# Patient Record
Sex: Female | Born: 1960
Health system: Southern US, Community
[De-identification: ages and names within clinical notes are randomized; demographics above are authoritative.]

## PROBLEM LIST (undated history)

## (undated) DIAGNOSIS — K635 Polyp of colon: Secondary | ICD-10-CM

## (undated) DIAGNOSIS — R636 Underweight: Secondary | ICD-10-CM

## (undated) DIAGNOSIS — C50919 Malignant neoplasm of unspecified site of unspecified female breast: Secondary | ICD-10-CM

## (undated) DIAGNOSIS — Z923 Personal history of irradiation: Secondary | ICD-10-CM

## (undated) DIAGNOSIS — K649 Unspecified hemorrhoids: Secondary | ICD-10-CM

## (undated) DIAGNOSIS — K589 Irritable bowel syndrome without diarrhea: Secondary | ICD-10-CM

## (undated) DIAGNOSIS — M84376A Stress fracture, unspecified foot, initial encounter for fracture: Secondary | ICD-10-CM

## (undated) HISTORY — DX: Polyp of colon: K63.5

## (undated) HISTORY — DX: Malignant neoplasm of unspecified site of unspecified female breast: C50.919

## (undated) HISTORY — DX: Underweight: R63.6

## (undated) HISTORY — DX: Irritable bowel syndrome without diarrhea: K58.9

## (undated) HISTORY — DX: Stress fracture, unspecified foot, initial encounter for fracture: M84.376A

## (undated) HISTORY — DX: Personal history of irradiation: Z92.3

---

## 2002-04-26 HISTORY — PX: BREAST SURGERY: SHX581

## 2004-04-26 HISTORY — PX: BREAST BIOPSY: SHX20

## 2004-04-26 HISTORY — PX: COLONOSCOPY: SHX174

## 2004-07-31 ENCOUNTER — Ambulatory Visit: Payer: Self-pay | Admitting: Unknown Physician Specialty

## 2004-08-25 ENCOUNTER — Ambulatory Visit: Payer: Self-pay | Admitting: Gastroenterology

## 2004-09-24 ENCOUNTER — Ambulatory Visit: Payer: Self-pay | Admitting: Unknown Physician Specialty

## 2005-04-02 ENCOUNTER — Ambulatory Visit: Payer: Self-pay | Admitting: Unknown Physician Specialty

## 2005-04-07 ENCOUNTER — Ambulatory Visit: Payer: Self-pay | Admitting: Unknown Physician Specialty

## 2005-04-26 DIAGNOSIS — K589 Irritable bowel syndrome without diarrhea: Secondary | ICD-10-CM

## 2005-04-26 HISTORY — DX: Irritable bowel syndrome, unspecified: K58.9

## 2005-08-27 ENCOUNTER — Ambulatory Visit: Payer: Self-pay | Admitting: Unknown Physician Specialty

## 2007-08-24 ENCOUNTER — Emergency Department: Payer: Self-pay | Admitting: Internal Medicine

## 2009-06-13 ENCOUNTER — Ambulatory Visit: Payer: Self-pay | Admitting: Unknown Physician Specialty

## 2009-12-25 HISTORY — PX: EXPLORATORY LAPAROTOMY: SUR591

## 2009-12-25 HISTORY — PX: ENDOMETRIAL ABLATION: SHX621

## 2010-01-05 ENCOUNTER — Ambulatory Visit: Payer: Self-pay | Admitting: Unknown Physician Specialty

## 2010-01-08 ENCOUNTER — Ambulatory Visit: Payer: Self-pay | Admitting: Unknown Physician Specialty

## 2010-01-12 LAB — PATHOLOGY REPORT

## 2010-06-18 ENCOUNTER — Ambulatory Visit: Payer: Self-pay | Admitting: Unknown Physician Specialty

## 2010-11-12 ENCOUNTER — Ambulatory Visit: Payer: Self-pay | Admitting: General Practice

## 2010-12-10 ENCOUNTER — Encounter: Payer: Self-pay | Admitting: Urology

## 2010-12-26 ENCOUNTER — Encounter: Payer: Self-pay | Admitting: Urology

## 2011-01-25 ENCOUNTER — Encounter: Payer: Self-pay | Admitting: Urology

## 2011-02-25 ENCOUNTER — Encounter: Payer: Self-pay | Admitting: Urology

## 2011-03-27 ENCOUNTER — Encounter: Payer: Self-pay | Admitting: Urology

## 2011-05-16 ENCOUNTER — Ambulatory Visit: Payer: Self-pay | Admitting: Orthopedic Surgery

## 2011-06-22 ENCOUNTER — Encounter: Payer: Self-pay | Admitting: Internal Medicine

## 2011-06-22 ENCOUNTER — Ambulatory Visit (INDEPENDENT_AMBULATORY_CARE_PROVIDER_SITE_OTHER): Payer: BC Managed Care – PPO | Admitting: Internal Medicine

## 2011-06-22 VITALS — BP 92/50 | HR 88 | Temp 97.8°F | Resp 14 | Ht 67.5 in | Wt 119.5 lb

## 2011-06-22 DIAGNOSIS — R232 Flushing: Secondary | ICD-10-CM

## 2011-06-22 DIAGNOSIS — K589 Irritable bowel syndrome without diarrhea: Secondary | ICD-10-CM

## 2011-06-22 DIAGNOSIS — Z13220 Encounter for screening for lipoid disorders: Secondary | ICD-10-CM

## 2011-06-22 DIAGNOSIS — Z1239 Encounter for other screening for malignant neoplasm of breast: Secondary | ICD-10-CM

## 2011-06-22 DIAGNOSIS — M84376A Stress fracture, unspecified foot, initial encounter for fracture: Secondary | ICD-10-CM | POA: Insufficient documentation

## 2011-06-22 DIAGNOSIS — J45909 Unspecified asthma, uncomplicated: Secondary | ICD-10-CM

## 2011-06-22 DIAGNOSIS — R636 Underweight: Secondary | ICD-10-CM

## 2011-06-22 DIAGNOSIS — N951 Menopausal and female climacteric states: Secondary | ICD-10-CM

## 2011-06-22 NOTE — Patient Instructions (Signed)
I will send you your labs after I review them if you sign up before they are resulted

## 2011-06-22 NOTE — Assessment & Plan Note (Addendum)
Diagnoses made by Dr. Mechele Collin after evaluation with normal colonoscopy 2007,  Now managed with dietary avoidance of caffeine and dairy

## 2011-06-22 NOTE — Progress Notes (Signed)
Subjective:    Patient ID: Kim Woods, female    DOB: 13-Mar-1961, 51 y.o.   MRN: [id number]  HPI  Kim Woods is a 51 yr old white female with no significant past medical history who is here to establish primary care. She was recently treated for presumed candidal vaginitis by Kim Woods  initially with antifungal ointment  followed by a  one time dose of fluconazole.   Her vaginalDischarge has cleared up but she still feels "raw" down there. she does not douche and has not changed soaps. She may be perimenopausal as she has been recently experiencing hot flashes she has a History of endometrial ablation Sept 2011 for heavy periods,   which caused her to spot for 6 months ,  followed by amenorrhea . Had a recent cortisone shot in right foot one month prior to onset of hot flashes and  vaginal symptoms .  She  is a history of adverse reactions involving numbness and tingling of the hands and feet to several unrelated antibiotics including ciprofloxacin and azithromycin as well as Macrobid.  She underwent a prior neurological workup for multiple sclerosis after the first incident of numbness and tingling.  She also has a history of a sesamoid bone fracture of the right foot which did not respond to orthopedic bleeding but only to the steroid injection that she received   Past Medical History  Diagnosis Date  . Stress fracture of foot   . Patient underweight   . Asthma     precipitated by seasonal allergies    No current outpatient prescriptions on file prior to visit.    Review of Systems  Constitutional: Negative for fever, chills and unexpected weight change.  HENT: Negative for hearing loss, ear pain, nosebleeds, congestion, sore throat, facial swelling, rhinorrhea, sneezing, mouth sores, trouble swallowing, neck pain, neck stiffness, voice change, postnasal drip, sinus pressure, tinnitus and ear discharge.   Eyes: Negative for pain, discharge, redness and visual disturbance.  Respiratory:  Negative for cough, chest tightness, shortness of breath, wheezing and stridor.   Cardiovascular: Negative for chest pain, palpitations and leg swelling.  Musculoskeletal: Negative for myalgias and arthralgias.  Skin: Negative for color change and rash.  Neurological: Negative for dizziness, weakness, light-headedness and headaches.  Hematological: Negative for adenopathy.       Objective:   Physical Exam  Constitutional: She is oriented to person, place, and time. She appears well-developed and well-nourished.       Underweight   HENT:  Mouth/Throat: Oropharynx is clear and moist.  Eyes: EOM are normal. Pupils are equal, round, and reactive to light. No scleral icterus.  Neck: Normal range of motion. Neck supple. No JVD present. No thyromegaly present.  Cardiovascular: Normal rate, regular rhythm, normal heart sounds and intact distal pulses.   Pulmonary/Chest: Effort normal and breath sounds normal.  Abdominal: Soft. Bowel sounds are normal. She exhibits no mass. There is no tenderness.  Musculoskeletal: Normal range of motion. She exhibits no edema.  Lymphadenopathy:    She has no cervical adenopathy.  Neurological: She is alert and oriented to person, place, and time.  Skin: Skin is warm and dry.  Psychiatric: She has a normal mood and affect.       Assessment & Plan:   Irritable bowel syndrome Diagnoses made by Dr. Mechele Collin after evaluation with normal colonoscopy 2007,  Now managed with dietary avoidance of caffeine and dairy  Asthma with allergic rhinitis She has been prescribed Flovent in the past for  episodes of asthma but appear to be precipitated by seasonal allergies. She has reduced her dose to 50 mcg and would like to discontinue it completely. I recommended that she begin Zyrtec daily starting 1-2 weeks prior to this season which typically give her trouble mainly spring and fall he has not had formal allergy testing. She's also not had formal pulmonary function  testing. We'll defer this testing for now and  Patient underweight Patient's weight has been stable for several years she has been under weight her entire adult life. She has no marfanoid features. There is no family history of Marfan is a poor or signs or symptoms of Marfanism.  She has had her thyroid checked repeatedly as mental function is normal. She has no history of eating disorders and recognizes that she is underweight. I recommended that she start a weightlifting program to build especially given her her at increased risk for osteoporosis and, and consider any more protein to her .diet    Updated Medication List Outpatient Encounter Prescriptions as of 06/22/2011  Medication Sig Dispense Refill  . calcium citrate-vitamin D 200-200 MG-UNIT TABS Take 2 tablets by mouth daily.      . fluticasone (FLOVENT DISKUS) 50 MCG/BLIST diskus inhaler Inhale 1 puff into the lungs 2 (two) times daily.

## 2011-06-22 NOTE — Assessment & Plan Note (Signed)
Patient's weight has been stable for several years she has been under weight her entire adult life. She has no marfanoid features. There is no family history of Marfan is a poor or signs or symptoms of Marfanism.  She has had her thyroid checked repeatedly as mental function is normal. She has no history of eating disorders and recognizes that she is underweight. I recommended that she start a weightlifting program to build especially given her her at increased risk for osteoporosis and, and consider any more protein to her .diet

## 2011-06-22 NOTE — Assessment & Plan Note (Signed)
She has been prescribed Flovent in the past for episodes of asthma but appear to be precipitated by seasonal allergies. She has reduced her dose to 50 mcg and would like to discontinue it completely. I recommended that she begin Zyrtec daily starting 1-2 weeks prior to this season which typically give her trouble mainly spring and fall he has not had formal allergy testing. She's also not had formal pulmonary function testing. We'll defer this testing for now and

## 2011-06-25 ENCOUNTER — Ambulatory Visit: Payer: BC Managed Care – PPO

## 2011-06-25 ENCOUNTER — Other Ambulatory Visit (INDEPENDENT_AMBULATORY_CARE_PROVIDER_SITE_OTHER): Payer: BC Managed Care – PPO | Admitting: *Deleted

## 2011-06-25 DIAGNOSIS — N951 Menopausal and female climacteric states: Secondary | ICD-10-CM

## 2011-06-25 DIAGNOSIS — K589 Irritable bowel syndrome without diarrhea: Secondary | ICD-10-CM

## 2011-06-25 DIAGNOSIS — Z1239 Encounter for other screening for malignant neoplasm of breast: Secondary | ICD-10-CM

## 2011-06-25 DIAGNOSIS — Z13220 Encounter for screening for lipoid disorders: Secondary | ICD-10-CM

## 2011-06-25 DIAGNOSIS — R232 Flushing: Secondary | ICD-10-CM

## 2011-06-25 LAB — LIPID PANEL
HDL: 68.1 mg/dL (ref >39.00)
LDL Cholesterol: 67 mg/dL (ref 0–99)
Total CHOL/HDL Ratio: 2
Triglycerides: 31 mg/dL (ref 0.0–149.0)

## 2011-06-25 LAB — COMPREHENSIVE METABOLIC PANEL
ALT: 15 U/L (ref 0–35)
AST: 17 U/L (ref 0–37)
CO2: 27 mEq/L (ref 19–32)
Calcium: 9.2 mg/dL (ref 8.4–10.5)
Chloride: 102 mEq/L (ref 96–112)
Creatinine, Ser: 0.9 mg/dL (ref 0.4–1.2)
GFR: 74.52 mL/min (ref >60.00)
Sodium: 136 mEq/L (ref 135–145)
Total Bilirubin: 0.6 mg/dL (ref 0.3–1.2)
Total Protein: 7.3 g/dL (ref 6.0–8.3)

## 2011-06-25 LAB — LUTEINIZING HORMONE: LH: 10.9 m[IU]/mL

## 2011-06-25 LAB — HM PAP SMEAR: HM Pap smear: NORMAL

## 2011-07-01 ENCOUNTER — Telehealth: Payer: Self-pay | Admitting: *Deleted

## 2011-07-01 MED ORDER — FLUCONAZOLE 150 MG PO TABS: 150.0000 mg | ORAL_TABLET | Freq: Once | ORAL | Status: AC

## 2011-07-01 NOTE — Telephone Encounter (Signed)
Triage Record Num: [id number] Operator: Amy Head Patient Name: Kim Woods Call Date & Time: 06/30/2011 5:20:38PM Patient Phone: [phone] PCP: Duncan Dull Patient Gender: Female PCP Fax : [phone] Patient DOB: [birth year] Practice Name: [facility] Reason for Call: Caller: [name]/Patient; PCP: Duncan Dull; CB#: [phone]; ; ; Call regarding Yeast Infection; Spoke with Dr. Darrick Huntsman about this last week when she was in the office. Was told if sxs persisted to try another round of Diflucan. Has taken Diflucan (06/14/11) and Nystatin Ointment externally for 10 days past that. (Both meds were prescribed by OBGYN). Pt placed a call to OBGYN office today and has not heard back. SXS improved somewhat. Still having itching and irritation with a small amount of discharge. Afebrile. All emergent sxs per Vaginal Discharge Or Irritation protocol r/o. Home care advice given. Advised to F/U with PCP or OBGYN within 24 hours per guidelines. Protocol(s) Used: Vaginal Discharge or Irritation Recommended Outcome per Protocol: See Provider within 24 hours Reason for Outcome: Discharge or vaginal symptoms worsening despite 72 hours or more of treatment Care Advice: Call your provider if you develop constant low back pain, constant or worsening lower abdominal pain or tenderness, temperature of 100.5 F (38.1 C), or any temperature elevation if immunocompromised (such as diabetes, HIV/AIDS, renal disease, chemotherapy, organ transplant, or chronic steroid use). ~ Do not have sex with a partner who has a genital lesion (penis, scrotum, vulva, vagina, anus) or a discharge from the vagina or penis, or when you have a lesion or discharge. When resuming sexual relations, it is best to always use a condom to avoid an STD (sexually transmitted disease). Having multiple sexual partner increases your chance of getting an STD. ~ ~ SYMPTOM / CONDITION MANAGEMENT ~ CAUTIONS ~ To help relieve  itching/irritation, try a cool compress to vulva using a washcloth for 10-15 minutes. If pregnancy is known or suspected, get advice from provider before using any nonprescription medication other than acetaminophen ~ Refrain from douching, using scented deodorant tampons, or nonprescription medication until evaluated by provider. Do not use feminine hygiene sprays. Use condoms during sex. ~ 06/30/2011 5:33:07PM Page 1 of 1 [id number]

## 2011-07-01 NOTE — Telephone Encounter (Signed)
Patient notified Rx called in.

## 2011-07-01 NOTE — Telephone Encounter (Signed)
Ok to call in fluconazole 150 mg one tablet daily for 2 days  #2 no refllls.

## 2011-07-12 ENCOUNTER — Telehealth: Payer: Self-pay | Admitting: Internal Medicine

## 2011-07-12 NOTE — Telephone Encounter (Signed)
Patient called requesting her lab results.  It looks like they are in her chart but no comments are made yet.  Please advise.

## 2011-07-12 NOTE — Telephone Encounter (Signed)
she was notified  on march 3 by [facility] that her hormone level were not menopausal, but we didn't tell her that her cholesterol and liver/kidney function were normal. ,

## 2011-07-13 NOTE — Telephone Encounter (Signed)
Left message asking patient to call the office.  

## 2011-07-15 NOTE — Telephone Encounter (Signed)
Patient notified of results.

## 2011-07-20 ENCOUNTER — Ambulatory Visit: Payer: Self-pay | Admitting: Internal Medicine

## 2011-07-28 ENCOUNTER — Encounter: Payer: Self-pay | Admitting: Internal Medicine

## 2011-08-18 ENCOUNTER — Telehealth: Payer: Self-pay | Admitting: Internal Medicine

## 2011-08-18 NOTE — Telephone Encounter (Signed)
[phone] Pt called wanted recommendation  for a gyn she used to go to dr kincus and she is not happy with the care she is getting @ [facility] now Who would you recommend. Does not necessary have to be in [place]

## 2011-08-18 NOTE — Telephone Encounter (Signed)
Dr. Greggory Keen,  [facility]

## 2011-08-19 NOTE — Telephone Encounter (Signed)
Patient notified

## 2011-09-15 ENCOUNTER — Encounter: Payer: Self-pay | Admitting: Obstetrics and Gynecology

## 2011-10-01 ENCOUNTER — Ambulatory Visit (INDEPENDENT_AMBULATORY_CARE_PROVIDER_SITE_OTHER): Payer: BC Managed Care – PPO | Admitting: Internal Medicine

## 2011-10-01 ENCOUNTER — Encounter: Payer: Self-pay | Admitting: Internal Medicine

## 2011-10-01 VITALS — BP 82/52 | HR 74 | Temp 97.7°F | Resp 16 | Wt 111.8 lb

## 2011-10-01 DIAGNOSIS — N760 Acute vaginitis: Secondary | ICD-10-CM

## 2011-10-01 DIAGNOSIS — M8430XA Stress fracture, unspecified site, initial encounter for fracture: Secondary | ICD-10-CM

## 2011-10-01 DIAGNOSIS — L603 Nail dystrophy: Secondary | ICD-10-CM

## 2011-10-01 DIAGNOSIS — L608 Other nail disorders: Secondary | ICD-10-CM

## 2011-10-01 DIAGNOSIS — R636 Underweight: Secondary | ICD-10-CM

## 2011-10-01 DIAGNOSIS — M84376A Stress fracture, unspecified foot, initial encounter for fracture: Secondary | ICD-10-CM

## 2011-10-01 NOTE — Progress Notes (Signed)
Patient ID: Kim Woods, female   DOB: 1961-04-24, 51 y.o.   MRN: [id number] Patient Active Problem List  Diagnoses  . Stress fracture of foot  . Patient underweight  . Irritable bowel syndrome  . Asthma with allergic rhinitis  . Nail dystrophy  . Underweight  . Vaginitis and vulvovaginitis    Subjective:  CC:   Chief Complaint  Patient presents with  . Blood Sugar Problem    HPI:   Kim Woods a 51 y.o. female who presents  Past Medical History  Diagnosis Date  . Stress fracture of foot   . Patient underweight   . Asthma     precipitated by seasonal allergies     Past Surgical History  Procedure Date  . Breast surgery     left, normal age 14  . Endometrial ablation          The following portions of the patient's history were reviewed and updated as appropriate: Allergies, current medications, and problem list.    Review of Systems:   14 Pt  review of systems was negative except those addressed in the HPI,     History   Social History  . Marital Status: Married    Spouse Name: N/A    Number of Children: N/A  . Years of Education: N/A   Occupational History  . Not on file.   Social History Main Topics  . Smoking status: Never Smoker   . Smokeless tobacco: Never Used  . Alcohol Use: Yes  . Drug Use: No  . Sexually Active: Not on file   Other Topics Concern  . Not on file   Social History Narrative  . No narrative on file    Objective:  BP 82/52  Pulse 74  Temp(Src) 97.7 F (36.5 C) (Oral)  Resp 16  Wt 111 lb 12 oz (50.689 kg)  SpO2 94%  General appearance: alert, cooperative and appears stated age Ears: normal TM's and external ear canals both ears Throat: lips, mucosa, and tongue normal; teeth and gums normal Neck: no adenopathy, no carotid bruit, supple, symmetrical, trachea midline and thyroid not enlarged, symmetric, no tenderness/mass/nodules Back: symmetric, no curvature. ROM normal. No CVA tenderness. Lungs: clear to  auscultation bilaterally Heart: regular rate and rhythm, S1, S2 normal, no murmur, click, rub or gallop Abdomen: soft, non-tender; bowel sounds normal; no masses,  no organomegaly Pulses: 2+ and symmetric Skin: Skin color, texture, turgor normal. No rashes or lesions Lymph nodes: Cervical, supraclavicular, and axillary nodes normal.  Assessment and Plan:  Nail dystrophy Unclear what her nail dystrophy is caused by.  Will refer to dermatology  Vaginitis and vulvovaginitis Etiology unclear.  i do not have records so it is unclear what the cultures have grown.  She has no signs of diabetes . Reassurance provided.   Stress fracture of foot Given her very low BMI of 17 and slow to heal fracture,  Will check protein stores, thyroid,  DEXA scan and Vit D level.     Updated Medication List Outpatient Encounter Prescriptions as of 10/01/2011  Medication Sig Dispense Refill  . calcium citrate-vitamin D 200-200 MG-UNIT TABS Take 2 tablets by mouth daily.      . fish oil-omega-3 fatty acids 1000 MG capsule Take 2 g by mouth daily.      . fluticasone (FLOVENT DISKUS) 50 MCG/BLIST diskus inhaler Inhale 1 puff into the lungs 2 (two) times daily.      . vitamin C (ASCORBIC ACID) 500 MG tablet Take  500 mg by mouth daily.         Orders Placed This Encounter  Procedures  . DG Bone Density  . Vitamin D 25 hydroxy  . TSH  . COMPLETE METABOLIC PANEL WITH GFR  . Heavy metals screen, urine    No Follow-up on file.

## 2011-10-03 NOTE — Assessment & Plan Note (Signed)
Etiology unclear.  i do not have records so it is unclear what the cultures have grown.  She has no signs of diabetes . Reassurance provided.

## 2011-10-03 NOTE — Assessment & Plan Note (Signed)
Given her very low BMI of 17 and slow to heal fracture,  Will check protein stores, thyroid,  DEXA scan and Vit D level.

## 2011-10-03 NOTE — Assessment & Plan Note (Signed)
Unclear what her nail dystrophy is caused by.  Will refer to dermatology

## 2011-10-05 NOTE — Progress Notes (Signed)
Addended by: Jobie Quaker on: 10/05/2011 04:44 PM   Modules accepted: Orders

## 2011-10-06 ENCOUNTER — Other Ambulatory Visit (INDEPENDENT_AMBULATORY_CARE_PROVIDER_SITE_OTHER): Payer: BC Managed Care – PPO | Admitting: *Deleted

## 2011-10-06 DIAGNOSIS — R636 Underweight: Secondary | ICD-10-CM

## 2011-10-06 DIAGNOSIS — M8430XA Stress fracture, unspecified site, initial encounter for fracture: Secondary | ICD-10-CM

## 2011-10-06 DIAGNOSIS — M84376A Stress fracture, unspecified foot, initial encounter for fracture: Secondary | ICD-10-CM

## 2011-10-06 LAB — TSH: TSH: 1.26 u[IU]/mL (ref 0.35–5.50)

## 2011-10-07 LAB — COMPLETE METABOLIC PANEL WITH GFR
Albumin: 4.3 g/dL (ref 3.5–5.2)
Alkaline Phosphatase: 41 U/L (ref 39–117)
BUN: 11 mg/dL (ref 6–23)
CO2: 25 mEq/L (ref 19–32)
Calcium: 9.5 mg/dL (ref 8.4–10.5)
Chloride: 102 mEq/L (ref 96–112)
GFR, Est African American: >89 mL/min
GFR, Est Non African American: 80 mL/min
Glucose, Bld: 71 mg/dL (ref 70–99)
Potassium: 4.2 mEq/L (ref 3.5–5.3)
Sodium: 137 mEq/L (ref 135–145)
Total Protein: 7.2 g/dL (ref 6.0–8.3)

## 2011-10-08 ENCOUNTER — Other Ambulatory Visit: Payer: Self-pay | Admitting: Internal Medicine

## 2011-10-08 DIAGNOSIS — N760 Acute vaginitis: Secondary | ICD-10-CM

## 2011-10-09 LAB — HEAVY METALS SCREEN, URINE: Arsenic, 24H Ur: 3 mcg/L (ref <81)

## 2011-10-18 ENCOUNTER — Encounter: Payer: Self-pay | Admitting: Obstetrics and Gynecology

## 2011-10-19 ENCOUNTER — Telehealth: Payer: Self-pay | Admitting: Internal Medicine

## 2011-10-19 ENCOUNTER — Encounter: Payer: Self-pay | Admitting: Internal Medicine

## 2011-10-19 DIAGNOSIS — N761 Subacute and chronic vaginitis: Secondary | ICD-10-CM

## 2011-10-27 ENCOUNTER — Telehealth: Payer: Self-pay | Admitting: Internal Medicine

## 2011-10-27 NOTE — Telephone Encounter (Signed)
Patient wanting labs put in my chart wants them to be in my chart by Monday. If you are unable to do this please call patient to pick up labs.

## 2011-10-29 NOTE — Telephone Encounter (Signed)
Patient is going to pick up lab results on Monday.

## 2011-11-15 NOTE — Telephone Encounter (Signed)
Opened in error

## 2011-11-17 ENCOUNTER — Ambulatory Visit: Payer: Self-pay | Admitting: Internal Medicine

## 2011-11-19 ENCOUNTER — Telehealth: Payer: Self-pay | Admitting: Internal Medicine

## 2011-11-19 NOTE — Telephone Encounter (Signed)
Patient notified of results.

## 2011-11-19 NOTE — Telephone Encounter (Signed)
Left message asking patient to return my call.

## 2011-11-19 NOTE — Telephone Encounter (Signed)
Bone density test was excellent normal bone density.

## 2011-11-30 ENCOUNTER — Encounter: Payer: Self-pay | Admitting: Internal Medicine

## 2012-04-26 DIAGNOSIS — Z923 Personal history of irradiation: Secondary | ICD-10-CM

## 2012-04-26 HISTORY — PX: BREAST LUMPECTOMY: SHX2

## 2012-04-26 HISTORY — DX: Personal history of irradiation: Z92.3

## 2012-04-26 HISTORY — PX: BREAST BIOPSY: SHX20

## 2012-06-10 ENCOUNTER — Other Ambulatory Visit: Payer: Self-pay

## 2012-06-21 ENCOUNTER — Telehealth: Payer: Self-pay | Admitting: Internal Medicine

## 2012-06-21 ENCOUNTER — Encounter: Payer: Self-pay | Admitting: Internal Medicine

## 2012-06-21 ENCOUNTER — Ambulatory Visit (INDEPENDENT_AMBULATORY_CARE_PROVIDER_SITE_OTHER): Payer: BC Managed Care – PPO | Admitting: Internal Medicine

## 2012-06-21 VITALS — BP 98/68 | HR 70 | Temp 97.7°F | Resp 12 | Ht 67.5 in | Wt 117.2 lb

## 2012-06-21 DIAGNOSIS — N760 Acute vaginitis: Secondary | ICD-10-CM

## 2012-06-21 DIAGNOSIS — N63 Unspecified lump in unspecified breast: Secondary | ICD-10-CM

## 2012-06-21 DIAGNOSIS — N6320 Unspecified lump in the left breast, unspecified quadrant: Secondary | ICD-10-CM

## 2012-06-21 DIAGNOSIS — C50919 Malignant neoplasm of unspecified site of unspecified female breast: Secondary | ICD-10-CM | POA: Insufficient documentation

## 2012-06-21 NOTE — Assessment & Plan Note (Addendum)
Found by self exam one week ago. On my exam she has a 0.5 cm solid mobile mass in the left breast at the 1:00 position adjacent to nipple .  Diagnostic mammogram with ultrasound was done at [facility] today and did not show a mass at this site, but targeted ultrasound confirmed the presence of a irregular hypoechoic mass with non-circumcised margins measuring 8 x 6 x 8 mm.    Given patient's recent use of estrogen for several months, increased concern for breast cancer raised. Referral to Dr. Adela Glimpse with appointment on March 11.

## 2012-06-21 NOTE — Progress Notes (Signed)
Patient ID: Kim Woods, female   DOB: 1960-07-18, 52 y.o.   MRN: [id number]     Patient Active Problem List  Diagnosis  . Stress fracture of foot  . Patient underweight  . Irritable bowel syndrome  . Asthma with allergic rhinitis  . Nail dystrophy  . Underweight  . Vaginitis and vulvovaginitis  . Mass of breast, left    Subjective:  CC:   Chief Complaint  Patient presents with  . lump left breast    HPI:   Kim Woods a 52 y.o. female who presents with a left sided breast mass.  Kim Woods has a history of left breast  biopsy in 2004 which was benign, recently treated by OBGYN with vaginal followed by transdermal estrogen from April 2013 to July 2013 for persistent vulvovaginitis,  who reports feeling a new lump in her left breast one week ago .  She is s/p  NOVASURE endometrial ablation Sept 2011 for heavy menses resulting in anemia and has not had a menstrual cycle since then so she is not sure if the change is related to hormonal fluctuation but she reports no breast tenderness and no recent URI symptoms. Her last screening mammogram was  July 20 2011 at [place], with scattered calcifications and dense breasts noted,  BIRADS 2 by report.    Past Medical History  Diagnosis Date  . Stress fracture of foot   . Patient underweight   . Asthma     precipitated by seasonal allergies   . Irritable bowel syndrome 2007    GI eval with colonoscopy [facility]    Past Surgical History  Procedure Laterality Date  . Breast surgery      left, normal age 83  . Endometrial ablation  Sept 2011    Novasure   Family History  Problem Relation Age of Onset  . Cancer Mother     uterine  . Mental illness Father 107    parkinsons disease  . Cancer Paternal Grandmother     ostesarcoma hip,    The following portions of the patient's history were reviewed and updated as appropriate: Allergies, current medications, and problem list.    Review of Systems:  Patient denies headache,  fevers, malaise, unintentional weight loss, skin rash, eye pain, sinus congestion and sinus pain, sore throat, dysphagia,  hemoptysis , cough, dyspnea, wheezing, chest pain, palpitations, orthopnea, edema, abdominal pain, nausea, melena, diarrhea, constipation, flank pain, dysuria, hematuria, urinary  Frequency, nocturia, numbness, tingling, seizures,  Focal weakness, Loss of consciousness,  Tremor, insomnia, depression, anxiety, and suicidal ideation.     History   Social History  . Marital Status: Married    Spouse Name: N/A    Number of Children: N/A  . Years of Education: N/A   Occupational History  . Not on file.   Social History Main Topics  . Smoking status: Never Smoker   . Smokeless tobacco: Never Used  . Alcohol Use: Yes  . Drug Use: No  . Sexually Active: Not on file   Other Topics Concern  . Not on file   Social History Narrative  . No narrative on file    Objective:  BP 98/68  Pulse 70  Temp(Src) 97.7 F (36.5 C)  Resp 12  Ht 5' 7.5" (1.715 m)  Wt 117 lb 4 oz (53.184 kg)  BMI 18.08 kg/m2  SpO2 98%  LMP 12/25/2009  General appearance: underweight but healthy appearing middle aged female. alert, cooperative and appears stated age Neck: no  adenopathy, no carotid bruit, supple, symmetrical, trachea midline and thyroid not enlarged, symmetric, no tenderness/mass/nodules Back: symmetric, no curvature. ROM normal. No CVA tenderness. Lungs: clear to auscultation bilaterally Breasts: 0.5 cm mobile nontender mass in at 1:00 position .  Biopsy scar LUO quadrant noted Heart: regular rate and rhythm, S1, S2 normal, no murmur, click, rub or gallop Abdomen: soft, non-tender; bowel sounds normal; no masses,  no organomegaly Pulses: 2+ and symmetric Skin: Skin color, texture, turgor normal. No rashes or lesions Lymph nodes: Cervical, supraclavicular, and axillary nodes normal.  Assessment and Plan:  Mass of breast, left Found by self exam one week ago. On my exam  she has a 0.5 cm solid mobile mass in the left breast at the 1:00 position adjacent to nipple .  Diagnostic mammogram with ultrasound was done at [facility] today and did not show a mass at this site, but targeted ultrasound confirmed the presence of a irregular hypoechoic mass with non-circumcised margins measuring 8 x 6 x 8 mm.    Given patient's recent use of estrogen for several months, increased concern for breast cancer raised. Referral to Dr. Adela Glimpse with appointment on March 11.  A total of 40 minutes was spent on patient more than half of which was spent in counseling, reviewing records from other providers and coordination of care. Updated Medication List Outpatient Encounter Prescriptions as of 06/21/2012  Medication Sig Dispense Refill  . calcium citrate-vitamin D 200-200 MG-UNIT TABS Take 2 tablets by mouth daily.      . fish oil-omega-3 fatty acids 1000 MG capsule Take 2 g by mouth daily.      . fluticasone (FLOVENT DISKUS) 50 MCG/BLIST diskus inhaler Inhale 1 puff into the lungs 2 (two) times daily.      . vitamin C (ASCORBIC ACID) 500 MG tablet Take 500 mg by mouth daily.       No facility-administered encounter medications on file as of 06/21/2012.     Orders Placed This Encounter  Procedures  . MM Digital Diagnostic Unilat L  . HM MAMMOGRAPHY  . HM PAP SMEAR  . HM PAP SMEAR  . HM COLONOSCOPY    No Follow-up on file.

## 2012-06-21 NOTE — Telephone Encounter (Signed)
Left message on voice mail  to call back

## 2012-06-21 NOTE — Telephone Encounter (Signed)
I received the report from the breast Center and they are recommending a biopsy of the lump we felt in her breast.  We discussed either Adela Glimpse or going to [facility] for the biopsy,  Does she have a preference?

## 2012-06-22 NOTE — Telephone Encounter (Signed)
Patient would like to go to Dr. Sheliah Hatch if Dr. Darrick Huntsman feels that he is a good choice.

## 2012-06-22 NOTE — Telephone Encounter (Signed)
Pt returned call.  Pt wanting to f/u on results of mammo and U/S and asking to speak with nurse regarding the recommended biopsy.

## 2012-06-22 NOTE — Telephone Encounter (Signed)
Patient called back and would really like this biopsy set up.  Can we please go ahead and get this referral in so Amber can set up referral. She doesn't want to go over anyone's head but she is really concern about getting this done. Please call patient at  [phone].

## 2012-06-22 NOTE — Telephone Encounter (Signed)
I called and spoke with patient and gave her the appointment with Dr. Lemar Livings it is for 07/04/12. Their office has put her on a wait list as well.  She was very thankful for [place] getting back with her.

## 2012-06-22 NOTE — Telephone Encounter (Signed)
I will place order, as Rosey Bath out this afternoon.

## 2012-06-23 ENCOUNTER — Telehealth: Payer: Self-pay | Admitting: Internal Medicine

## 2012-06-23 ENCOUNTER — Encounter: Payer: Self-pay | Admitting: Internal Medicine

## 2012-06-23 NOTE — Telephone Encounter (Signed)
LMOVM for pt to return call 

## 2012-06-23 NOTE — Telephone Encounter (Signed)
Pt states an appt was set up for her yesterday with Dr. Lemar Livings.  Pt has one question she would like to ask Dr. Darrick Huntsman about that appointment.  No further details given.

## 2012-06-24 ENCOUNTER — Encounter: Payer: Self-pay | Admitting: Internal Medicine

## 2012-06-24 ENCOUNTER — Telehealth: Payer: Self-pay | Admitting: Internal Medicine

## 2012-06-24 DIAGNOSIS — C50919 Malignant neoplasm of unspecified site of unspecified female breast: Secondary | ICD-10-CM

## 2012-06-24 HISTORY — DX: Malignant neoplasm of unspecified site of unspecified female breast: C50.919

## 2012-06-24 NOTE — Assessment & Plan Note (Signed)
Resolved post evaluation by [facility] gynecology in July 2013. Recommendations were to stop transdermal estrogen and continue probiotics. Prometrium was added to her transdermal estrogen therapy given that she has a uterus.

## 2012-06-27 HISTORY — PX: SENTINEL LYMPH NODE BIOPSY: SHX2392

## 2012-07-04 ENCOUNTER — Other Ambulatory Visit: Payer: Self-pay | Admitting: General Surgery

## 2012-07-04 DIAGNOSIS — C50912 Malignant neoplasm of unspecified site of left female breast: Secondary | ICD-10-CM

## 2012-07-04 NOTE — Telephone Encounter (Signed)
Cannot reach pt.  

## 2012-07-05 ENCOUNTER — Ambulatory Visit
Admission: RE | Admit: 2012-07-05 | Discharge: 2012-07-05 | Disposition: A | Discharge status: Home / Self Care | Payer: BC Managed Care – PPO | Source: Ambulatory Visit | Attending: General Surgery | Admitting: General Surgery

## 2012-07-05 DIAGNOSIS — C50912 Malignant neoplasm of unspecified site of left female breast: Secondary | ICD-10-CM

## 2012-07-05 MED ORDER — GADOBENATE DIMEGLUMINE 529 MG/ML IV SOLN
10.0000 mL | Freq: Once | INTRAVENOUS | Status: AC | PRN
  Administered 2012-07-05: 10 mL via INTRAVENOUS

## 2012-07-06 ENCOUNTER — Encounter: Payer: Self-pay | Admitting: Internal Medicine

## 2012-07-07 ENCOUNTER — Other Ambulatory Visit: Payer: BC Managed Care – PPO

## 2012-07-12 ENCOUNTER — Ambulatory Visit: Payer: Self-pay | Admitting: General Surgery

## 2012-07-12 DIAGNOSIS — C50919 Malignant neoplasm of unspecified site of unspecified female breast: Secondary | ICD-10-CM

## 2012-07-12 HISTORY — PX: LYMPH NODE BIOPSY: SHX201

## 2012-07-13 ENCOUNTER — Encounter: Payer: Self-pay | Admitting: *Deleted

## 2012-07-13 LAB — PATHOLOGY REPORT

## 2012-07-14 ENCOUNTER — Encounter: Payer: Self-pay | Admitting: Internal Medicine

## 2012-07-17 ENCOUNTER — Ambulatory Visit (INDEPENDENT_AMBULATORY_CARE_PROVIDER_SITE_OTHER): Payer: BC Managed Care – PPO | Admitting: General Surgery

## 2012-07-17 ENCOUNTER — Encounter: Payer: Self-pay | Admitting: General Surgery

## 2012-07-17 VITALS — BP 130/85 | HR 76 | Resp 12 | Ht 67.5 in | Wt 116.0 lb

## 2012-07-17 DIAGNOSIS — C50912 Malignant neoplasm of unspecified site of left female breast: Secondary | ICD-10-CM

## 2012-07-17 DIAGNOSIS — C50919 Malignant neoplasm of unspecified site of unspecified female breast: Secondary | ICD-10-CM

## 2012-07-17 NOTE — Patient Instructions (Signed)
Patient is able to return to exercise with light lifting. Patient is advised to continue monthly self breast checks. Patient to have discussion with medical oncology.

## 2012-07-17 NOTE — Progress Notes (Addendum)
Patient ID: Kim Woods, female   DOB: [birth year], 52 y.o.   MRN: [id number]  Chief Complaint  Patient presents with  . Routine Post Op    left breast sentinel node biopsy    HPI Kim Woods is a 52 y.o. female.   HPI Post-op Patient presents to the clinic 5 days following left breast sentinel node biopsy . Eating a regular diet without difficulty. Bowel movements are Normal. The patient is not having any pain. The only complaint at this time is patient states she is having swelling that comes and goes where surgical site is located. She states it's worse at throughout the day.  Past Medical History  Diagnosis Date  . Stress fracture of foot   . Patient underweight   . Asthma     precipitated by seasonal allergies   . Irritable bowel syndrome 2007    GI eval with colonoscopy [facility]    Past Surgical History  Procedure Laterality Date  . Endometrial ablation  Sept 2011    Novasure  . Sentinel lymph node biopsy Left 2014  . Colonoscopy  2006    Dr. Mechele Collin  . Breast surgery      left, normal age 35    Family History  Problem Relation Age of Onset  . Cancer Mother     uterine  . Mental illness Father 40    parkinsons disease  . Cancer Paternal Grandmother     ostesarcoma hip,     Social History History  Substance Use Topics  . Smoking status: Never Smoker   . Smokeless tobacco: Never Used  . Alcohol Use: Yes    Allergies  Allergen Reactions  . Ciprofloxacin Other (See Comments)    Numbness and tingling  . Nitrofurantoin Monohyd Macro     Current Outpatient Prescriptions  Medication Sig Dispense Refill  . calcium citrate-vitamin D 200-200 MG-UNIT TABS Take 2 tablets by mouth daily.      . fish oil-omega-3 fatty acids 1000 MG capsule Take 2 g by mouth daily.      . fluticasone (FLOVENT DISKUS) 50 MCG/BLIST diskus inhaler Inhale 1 puff into the lungs 2 (two) times daily.      . vitamin C (ASCORBIC ACID) 500 MG tablet Take 500 mg by mouth daily.       No  current facility-administered medications for this visit.    Review of Systems Review of Systems  Constitutional: Negative.   Respiratory: Negative.   Cardiovascular: Negative.     Blood pressure 130/85, pulse 76, resp. rate 12, height 5' 7.5" (1.715 m), weight 116 lb (52.617 kg), last menstrual period 12/25/2009.  Physical Exam Physical Exam  Constitutional: She appears well-developed and well-nourished.  Pulmonary/Chest: She exhibits no mass and no tenderness. Left breast exhibits no inverted nipple, no mass, no nipple discharge, no skin change and no tenderness.   No axillary swelling. All incisions are healing well.  Data Reviewed Pathology showed all sentinel nodes were negative for metastatic disease. The primary tumor was 7 mm in diameter with negative margins. He R./PR positive. HER-2/neu not overexpressing.  Assessment    T1b, N0 carcinoma of the left breast.    Plan    The indications for postoperative radiation therapy were reviewed. It is unlikely that she'll be a candidate for adjuvant chemotherapy but formal consult a medical oncology will be requested.     The opportunity for genetic testing was reviewed. As she is developed for cancer at age 86 she may well be  a candidate for BRCA analysis. The pros and cons of genetic testing were discussed. As she has a daughter it would be important to know if her daughter was at high risk. The patient the advisability of elective ovary removal would also be of great impact. Earline Mayotte 07/20/2012, 9:38 PM

## 2012-07-19 ENCOUNTER — Telehealth: Payer: Self-pay | Admitting: *Deleted

## 2012-07-19 NOTE — Telephone Encounter (Signed)
Patient has been scheduled for an appointment at the [facility] with Dr. Wendie Simmer for 07-27-12 at 8:30 am. She will see Dr. Rushie Chestnut immediately after. This patient is aware of date, time, and instructions.

## 2012-07-19 NOTE — Telephone Encounter (Signed)
Message copied by Nicholes Mango on Wed Jul 19, 2012  4:36 PM ------      Message from: [place], [place]      Created: Wed Jul 19, 2012 12:42 PM       Please arrange for the patient to see Dr. Dayna Barker and Dr. Rushie Chestnut at the cancer center re: T1B,N0 left breast cancer. Thanks. ------

## 2012-07-25 ENCOUNTER — Ambulatory Visit: Payer: Self-pay | Admitting: Hematology and Oncology

## 2012-07-25 ENCOUNTER — Encounter: Payer: Self-pay | Admitting: General Surgery

## 2012-07-26 ENCOUNTER — Ambulatory Visit: Payer: Self-pay | Admitting: Radiation Oncology

## 2012-08-02 ENCOUNTER — Encounter: Payer: Self-pay | Admitting: General Surgery

## 2012-08-02 ENCOUNTER — Ambulatory Visit (INDEPENDENT_AMBULATORY_CARE_PROVIDER_SITE_OTHER): Payer: BC Managed Care – PPO | Admitting: General Surgery

## 2012-08-02 VITALS — BP 90/52 | HR 72 | Resp 12 | Ht 68.0 in | Wt 115.0 lb

## 2012-08-02 DIAGNOSIS — C50919 Malignant neoplasm of unspecified site of unspecified female breast: Secondary | ICD-10-CM

## 2012-08-02 DIAGNOSIS — C50912 Malignant neoplasm of unspecified site of left female breast: Secondary | ICD-10-CM

## 2012-08-02 NOTE — Patient Instructions (Addendum)
Patient to have left diagnostic mammogram to be done September 2014. She is to call our office if any questions or concerns arise.

## 2012-08-02 NOTE — Progress Notes (Signed)
    Patient ID: Kim Woods, female   DOB: 07-30-1960, 52 y.o.   MRN: [id number]  Chief Complaint  Patient presents with  . Routine Post Op    HPI Kim Woods is a 52 y.o. female here today for her post op left breast sentinel node biopsy . Patient states no new problems.       HPI  Past Medical History  Diagnosis Date  . Stress fracture of foot   . Patient underweight   . Asthma     precipitated by seasonal allergies   . Irritable bowel syndrome 2007    GI eval with colonoscopy [facility]    Past Surgical History  Procedure Laterality Date  . Endometrial ablation  Sept 2011    Novasure  . Sentinel lymph node biopsy Left 2014  . Colonoscopy  2006    Dr. Mechele Collin  . Breast surgery      left, normal age 74  . Lymph node biopsy Left 2014    Family History  Problem Relation Age of Onset  . Cancer Mother     uterine  . Mental illness Father 68    parkinsons disease  . Cancer Paternal Grandmother     ostesarcoma hip,     Social History History  Substance Use Topics  . Smoking status: Never Smoker   . Smokeless tobacco: Never Used  . Alcohol Use: Yes    Allergies  Allergen Reactions  . Ciprofloxacin Other (See Comments)    Numbness and tingling  . Nitrofurantoin Monohyd Macro     Current Outpatient Prescriptions  Medication Sig Dispense Refill  . calcium citrate-vitamin D 200-200 MG-UNIT TABS Take 2 tablets by mouth daily.      . fish oil-omega-3 fatty acids 1000 MG capsule Take 2 g by mouth daily.      . fluticasone (FLOVENT DISKUS) 50 MCG/BLIST diskus inhaler Inhale 1 puff into the lungs 2 (two) times daily.      . vitamin C (ASCORBIC ACID) 500 MG tablet Take 500 mg by mouth daily.       No current facility-administered medications for this visit.    Review of Systems Review of Systems  Constitutional: Negative.   Respiratory: Negative.   Cardiovascular: Negative.     Blood pressure 90/52, pulse 72, resp. rate 12, height 5\' 8"  (1.727 m),  weight 115 lb (52.164 kg).  Physical Exam Physical Exam  Lymphadenopathy:    She has no axillary adenopathy.  Left sentinel area healing well.   The patient shows range of motion of the left shoulder. No axillary tenderness. The original excision site in the left breast in the upper-outer quadrant is healing well. Data Reviewed 7 mm invasive mammary carcinoma, node negative.  Assessment    The patient has been evaluated by medical and radiation oncology.  Oncotype DX testing is pending.    Plan    The patient will complete whole breast radiation in the near future.       Earline Mayotte 08/03/2012, 7:48 PM

## 2012-08-03 ENCOUNTER — Encounter: Payer: Self-pay | Admitting: General Surgery

## 2012-08-23 ENCOUNTER — Encounter: Payer: Self-pay | Admitting: Radiation Oncology

## 2012-08-23 DIAGNOSIS — C50912 Malignant neoplasm of unspecified site of left female breast: Secondary | ICD-10-CM

## 2012-08-23 NOTE — Progress Notes (Signed)
Location of Breast Cancer: left breast mass  Histology per Pathology Report: stage Ia (T1 B N0M0) invasive mammary carcinoma   Receptor Status: ER(+), PR (+), Her2-neu (-)  Did patient present with symptoms (if so, please note symptoms) or was this found on screening mammography?: self discovered left breast mass  Past/Anticipated interventions by surgeon, if any: left axillary sentinel node biopsy done 07/12/2012  Past/Anticipated interventions by medical oncology, if any: hormonal therapy following completion of radiation therapy  Lymphedema issues, if any:  None noted.   Pain issues, if any:  None noted.  SAFETY ISSUES:  Prior radiation? Presently, receiving radiation therapy at [facility]. Patient wishes to switch physicians.  Pacemaker/ICD? NO  Possible current pregnancy? NO  Is the patient on methotrexate? NO  Current Complaints / other details:  52 year old female. Ax: Nitrofuratoin and Ciprofloxacin.

## 2012-08-24 ENCOUNTER — Ambulatory Visit
Admission: RE | Admit: 2012-08-24 | Discharge: 2012-08-24 | Disposition: A | Discharge status: Home / Self Care | Payer: BC Managed Care – PPO | Source: Ambulatory Visit | Attending: Radiation Oncology | Admitting: Radiation Oncology

## 2012-08-24 ENCOUNTER — Ambulatory Visit: Payer: Self-pay | Admitting: Radiation Oncology

## 2012-08-24 ENCOUNTER — Encounter: Payer: Self-pay | Admitting: Radiation Oncology

## 2012-08-24 ENCOUNTER — Ambulatory Visit: Payer: Self-pay | Admitting: Hematology and Oncology

## 2012-08-24 VITALS — BP 131/72 | HR 86 | Temp 98.5°F | Resp 20 | Wt 115.5 lb

## 2012-08-24 DIAGNOSIS — C50912 Malignant neoplasm of unspecified site of left female breast: Secondary | ICD-10-CM

## 2012-08-24 DIAGNOSIS — Z510 Encounter for antineoplastic radiation therapy: Secondary | ICD-10-CM | POA: Insufficient documentation

## 2012-08-24 DIAGNOSIS — Z170 Estrogen receptor positive status [ER+]: Secondary | ICD-10-CM | POA: Insufficient documentation

## 2012-08-24 DIAGNOSIS — R51 Headache: Secondary | ICD-10-CM | POA: Insufficient documentation

## 2012-08-24 DIAGNOSIS — C50919 Malignant neoplasm of unspecified site of unspecified female breast: Secondary | ICD-10-CM | POA: Insufficient documentation

## 2012-08-24 NOTE — Progress Notes (Signed)
Please see the Nurse Progress Note in the MD Initial Consult Encounter for this patient. 

## 2012-08-24 NOTE — Progress Notes (Signed)
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 female who earlier this year was diagnosed with a stage I tubular carcinoma of the left breast. The patient underwent excisional biopsy in Dr Rutherford Nail office in the [place] area. This surgery was subsequently followed by sentinel node procedure at a later date. the patient's pathology report is not available for review at this time but per records showed a ER/PR positive tumor which was HER-2/neu negative. The surgical margins were clear. The patient did have a preoperative MRI of the breast area which showed only clip artifact in the upper outer quadrant of left breast with no other suspicious areas in either breast. The patient was felt to be a good candidate for breast conservation therapy and proceeded to undergo radiation therapy at the [facility]. The patient has now decided she would prefer to have her treatment in the [place] area.  After tomorrow the patient will have completed 10 radiation treatments to the left breast for a cumulative dose of 20 Gy.    PREVIOUS RADIATION THERAPY: Yes, as documented above  PAST MEDICAL HISTORY:  has a past medical history of Stress fracture of foot; Patient underweight; Irritable bowel syndrome (2007); Breast cancer; and Asthma.    PAST SURGICAL HISTORY: Past Surgical History  Procedure Laterality Date  . Endometrial ablation  Sept 2011    Novasure  . Sentinel lymph node biopsy Left 06/27/2012    incisional bx/lumpectomy  . Colonoscopy  2006    Dr. Mechele Collin  . Breast surgery  2004    left, normal age 47, incisional bx  . Lymph  node biopsy Left 07/12/2012    FAMILY HISTORY: family history includes Cancer in her mother and paternal grandmother and Mental illness (age of onset: 46) in her father.  SOCIAL HISTORY:  reports that she has never smoked. She has never used smokeless tobacco. She reports that  drinks alcohol. She reports that she does not use illicit drugs.  ALLERGIES: Ciprofloxacin and Nitrofurantoin monohyd macro  MEDICATIONS:  Current Outpatient Prescriptions  Medication Sig Dispense Refill  . azelastine (ASTELIN) 137 MCG/SPRAY nasal spray       . benzonatate (TESSALON) 100 MG capsule       . calcium citrate-vitamin D 200-200 MG-UNIT TABS Take 2 tablets by mouth daily.      . vitamin C (ASCORBIC ACID) 500 MG tablet Take 500 mg by mouth daily.       No current facility-administered medications for this encounter.    REVIEW OF SYSTEMS:  A 15 point review of systems is documented in the electronic medical record. This was obtained by the nursing staff. However, I reviewed this with the patient to discuss relevant findings and make appropriate changes.  She has had some mild discomfort in the left breast and has noticed some mild swelling in the left breast. She denies any cough or breathing problems. She denies any swelling in her left arm or hand.  PHYSICAL EXAM:  weight is 115 lb 8 oz (52.39 kg). Her oral temperature is 98.5 F (36.9 C). Her blood pressure is 131/72 and her pulse is 86. Her respiration is  20.  Examination of the neck and supraclavicular region reveals no evidence of adenopathy. The axillary areas are free of adenopathy. Examination of the lungs reveals them to be clear. The heart has a regular rhythm and rate. Examination of the right breast reveals some fibrocystic changes but no dominant masses appreciated. There is no nipple discharge or bleeding noted. Examination of the left breast reveals some mild erythema throughout the breast region. There is a well-healed small scar in the upper  outer quadrant of the left breast and a separate small scar in the axillary region from the patient's sentinel node procedure. Tattoos are in place along the left chest area as well as some marks placed along the left breast area to aid in daily treatment.   LABORATORY DATA:  No results found for this basename: WBC, HGB, HCT, MCV, PLT   Lab Results  Component Value Date   NA 137 10/06/2011   K 4.2 10/06/2011   CL 102 10/06/2011   CO2 25 10/06/2011   Lab Results  Component Value Date   ALT 13 10/06/2011   AST 18 10/06/2011   ALKPHOS 41 10/06/2011   BILITOT 0.7 10/06/2011     RADIOGRAPHY: No results found.    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 Patient would be an excellent candidate for continuation of her breast conserving therapy.  She will receive her last radiation therapy in [facility] tomorrow and then resume her radiation therapy here in [facility] 5/6 or 5/7 after planning is complete.  PLAN: Simulation and planning later this afternoon.     ------------------------------------------------  -----------------------------------  Billie Lade, PhD, MD

## 2012-08-24 NOTE — Progress Notes (Signed)
Met with patient to discuss RO billing.  Patient stated that she feels she has already met her OOP and deductible for the year.  Had no other concerns.

## 2012-08-25 MED ORDER — RADIAPLEXRX EX GEL
Freq: Once | CUTANEOUS | Status: AC
  Administered 2012-08-25: 1 via TOPICAL

## 2012-08-25 NOTE — Addendum Note (Signed)
Encounter addended by: Lowella Petties, RN on: 08/25/2012  7:30 AM<BR>     Documentation filed: Inpatient MAR, Orders

## 2012-08-25 NOTE — Addendum Note (Signed)
Encounter addended by: Agnes Lawrence, RN on: 08/25/2012  8:27 AM<BR>     Documentation filed: Charges VN

## 2012-08-29 ENCOUNTER — Ambulatory Visit
Admission: RE | Admit: 2012-08-29 | Discharge: 2012-08-29 | Disposition: A | Discharge status: Home / Self Care | Payer: BC Managed Care – PPO | Source: Ambulatory Visit | Attending: Radiation Oncology | Admitting: Radiation Oncology

## 2012-08-29 DIAGNOSIS — C50912 Malignant neoplasm of unspecified site of left female breast: Secondary | ICD-10-CM

## 2012-08-29 LAB — CBC CANCER CENTER
Basophil #: 0 x10 3/mm (ref 0.0–0.1)
Basophil %: 0.6 %
Eosinophil #: 0.1 x10 3/mm (ref 0.0–0.7)
Eosinophil %: 1.2 %
HGB: 13.3 g/dL (ref 12.0–16.0)
Lymphocyte %: 19.4 %
Monocyte #: 0.5 x10 3/mm (ref 0.2–0.9)
Neutrophil #: 5.2 x10 3/mm (ref 1.4–6.5)
Platelet: 231 x10 3/mm (ref 150–440)
WBC: 7.3 x10 3/mm (ref 3.6–11.0)

## 2012-08-29 LAB — COMPREHENSIVE METABOLIC PANEL
Albumin: 3.9 g/dL (ref 3.4–5.0)
Alkaline Phosphatase: 62 U/L (ref 50–136)
Anion Gap: 10 (ref 7–16)
Bilirubin,Total: 0.4 mg/dL (ref 0.2–1.0)
Co2: 27 mmol/L (ref 21–32)
Creatinine: 1.12 mg/dL (ref 0.60–1.30)
EGFR (African American): >60
EGFR (Non-African Amer.): 57 — ABNORMAL LOW
Osmolality: 276 (ref 275–301)
Potassium: 4.1 mmol/L (ref 3.5–5.1)
SGPT (ALT): 25 U/L (ref 12–78)
Sodium: 138 mmol/L (ref 136–145)
Total Protein: 7.8 g/dL (ref 6.4–8.2)

## 2012-08-30 ENCOUNTER — Ambulatory Visit
Admission: RE | Admit: 2012-08-30 | Discharge: 2012-08-30 | Disposition: A | Discharge status: Home / Self Care | Payer: BC Managed Care – PPO | Source: Ambulatory Visit | Attending: Radiation Oncology | Admitting: Radiation Oncology

## 2012-08-30 DIAGNOSIS — C50912 Malignant neoplasm of unspecified site of left female breast: Secondary | ICD-10-CM

## 2012-08-30 NOTE — Progress Notes (Signed)
Post sim ed completed w/pt. Gave pt "Radiation and You" booklet w/all pertinent information marked and discussed,re :fatigue, skin irritation/care, nutrition, pain. Pt has purchased [employer] [place] deodorant to use, already received Radiaplex. Reviewed proper use of each of these products w/pt. Pt verbalized understanding w/teachback. No questions verbalized.

## 2012-08-30 NOTE — Progress Notes (Signed)
  Radiation Oncology         [phone]) [phone] ________________________________  Name: Kim Woods MRN: [id number]  Date: 08/29/2012  DOB: [birth year]  Simulation Verification Note  Status: outpatient  NARRATIVE: The patient was brought to the treatment unit and placed in the planned treatment position. The clinical setup was verified. Then port films were obtained and uploaded to the radiation oncology medical record software.  The treatment beams were carefully compared against the planned radiation fields. The position location and shape of the radiation fields was reviewed. They targeted volume of tissue appears to be appropriately covered by the radiation beams. Organs at risk appear to be excluded as planned.  Based on my personal review, I approved the simulation verification. The patient's treatment will proceed as planned.  -----------------------------------  Billie Lade, PhD, MD

## 2012-08-31 ENCOUNTER — Ambulatory Visit
Admission: RE | Admit: 2012-08-31 | Discharge: 2012-08-31 | Disposition: A | Discharge status: Home / Self Care | Payer: BC Managed Care – PPO | Source: Ambulatory Visit | Attending: Radiation Oncology | Admitting: Radiation Oncology

## 2012-09-01 ENCOUNTER — Ambulatory Visit
Admission: RE | Admit: 2012-09-01 | Discharge: 2012-09-01 | Disposition: A | Discharge status: Home / Self Care | Payer: BC Managed Care – PPO | Source: Ambulatory Visit | Attending: Radiation Oncology | Admitting: Radiation Oncology

## 2012-09-04 ENCOUNTER — Ambulatory Visit
Admission: RE | Admit: 2012-09-04 | Discharge: 2012-09-04 | Disposition: A | Discharge status: Home / Self Care | Payer: BC Managed Care – PPO | Source: Ambulatory Visit | Attending: Radiation Oncology | Admitting: Radiation Oncology

## 2012-09-05 ENCOUNTER — Ambulatory Visit
Admission: RE | Admit: 2012-09-05 | Discharge: 2012-09-05 | Disposition: A | Discharge status: Home / Self Care | Payer: BC Managed Care – PPO | Source: Ambulatory Visit | Attending: Radiation Oncology | Admitting: Radiation Oncology

## 2012-09-05 VITALS — BP 116/75 | HR 82 | Temp 97.7°F | Wt 116.9 lb

## 2012-09-05 DIAGNOSIS — C50912 Malignant neoplasm of unspecified site of left female breast: Secondary | ICD-10-CM

## 2012-09-05 NOTE — Progress Notes (Signed)
[facility]    Radiation Oncology [address] [place]     Maryln Gottron, M.D. [place], [place] [address]               Billie Lade, M.D., Ph.D. Phone: [phone]      Molli Hazard A. Kathrynn Running, M.D. Fax: [phone]      Radene Gunning, M.D., Ph.D.         Lurline Hare, M.D.         Grayland Jack, M.D Weekly Treatment Management Note  Name: Kim Woods     MRN: [id number]        CSN: [id number] Date: 09/05/2012      DOB: 07/19/1960  CC: Duncan Dull, MD         Darrick Huntsman    Status: Outpatient  Diagnosis: The encounter diagnosis was Breast cancer, left.  Current Dose: 27  Current Fraction: 15  Planned Dose: ~ 63 Gy  Narrative: Kim Woods was seen today for weekly treatment management. The chart was checked and port films  were reviewed. She is tolerating the treatments well at this time. She has noticed some sensitivity along the nipple and breast area.  She has some mild fatigue but does not attribute this to her radiation therapy.  Ciprofloxacin and Nitrofurantoin monohyd macro  Current Outpatient Prescriptions  Medication Sig Dispense Refill  . azelastine (ASTELIN) 137 MCG/SPRAY nasal spray       . calcium citrate-vitamin D 200-200 MG-UNIT TABS Take 2 tablets by mouth daily.      . hyaluronate sodium (RADIAPLEXRX) GEL Apply topically 2 (two) times daily.      . vitamin C (ASCORBIC ACID) 500 MG tablet Take 500 mg by mouth daily.       No current facility-administered medications for this encounter.   Labs:  No results found for this basename: WBC, HGB, HCT, MCV, PLT   Lab Results  Component Value Date   CREATININE 0.86 10/06/2011   BUN 11 10/06/2011   NA 137 10/06/2011   K 4.2 10/06/2011   CL 102 10/06/2011   CO2 25 10/06/2011   Lab Results  Component Value Date   ALT 13 10/06/2011   AST 18 10/06/2011   BILITOT 0.7 10/06/2011    Physical Examination:  weight is 116 lb 14.4 oz (53.025 kg). Her temperature is 97.7 F (36.5 C). Her blood pressure is 116/75  and her pulse is 82.    Wt Readings from Last 3 Encounters:  09/05/12 116 lb 14.4 oz (53.025 kg)  08/02/12 115 lb (52.164 kg)  07/17/12 116 lb (52.617 kg)    The left breast area shows some mild erythema without any skin breakdown. Lungs - Normal respiratory effort, chest expands symmetrically. Lungs are clear to auscultation, no crackles or wheezes.  Heart has regular rhythm and rate  Abdomen is soft and non tender with normal bowel sounds  Assessment:  Patient tolerating treatments well  Plan: Continue treatment per original radiation prescription

## 2012-09-05 NOTE — Progress Notes (Signed)
  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  Identity was confirmed.  All relevant records and images related to the planned course of therapy were reviewed.  The patient freely provided informed written consent to proceed with treatment after reviewing the details related to the planned course of therapy. The consent form was witnessed and verified by the simulation staff.  Then, the patient was set-up in a stable reproducible  supine position for radiation therapy.  CT images were obtained.  Surface markings were placed.  The CT images were loaded into the planning software.  Then the target and avoidance structures were contoured.  Treatment planning then occurred.  The radiation prescription was entered and confirmed.  Then, I designed and supervised the construction of a total of 3 medically necessary complex treatment devices.  I have requested : Isodose Plan.  I have ordered:dose calc.  PLAN:  The patient will receive 32.4 Gy in 18 fractions  to complete the patient's  Whole breast radiation therapy. cumulative dose to the left breast will be 50.4 gray.  The patient will then proceed with a boost directed at the lumpectomy cavity and continue to a cumulative dose of approximately 63-64 Gy.  ________________________________  -----------------------------------  Billie Lade, PhD, MD

## 2012-09-05 NOTE — Progress Notes (Signed)
Patient for weekly assessment of radiation to left breast.Has some mild breast and nipple discomfort.Skin changes pinl.Has completed 5 of 18 treatments at our facility and 10 treatments at [facility].No marked fatigue.Continue application of radiaplex 2 t 3 times daily.

## 2012-09-06 ENCOUNTER — Ambulatory Visit
Admission: RE | Admit: 2012-09-06 | Discharge: 2012-09-06 | Disposition: A | Discharge status: Home / Self Care | Payer: BC Managed Care – PPO | Source: Ambulatory Visit | Attending: Radiation Oncology | Admitting: Radiation Oncology

## 2012-09-07 ENCOUNTER — Ambulatory Visit
Admission: RE | Admit: 2012-09-07 | Discharge: 2012-09-07 | Disposition: A | Discharge status: Home / Self Care | Payer: BC Managed Care – PPO | Source: Ambulatory Visit | Attending: Radiation Oncology | Admitting: Radiation Oncology

## 2012-09-08 ENCOUNTER — Ambulatory Visit
Admission: RE | Admit: 2012-09-08 | Discharge: 2012-09-08 | Disposition: A | Discharge status: Home / Self Care | Payer: BC Managed Care – PPO | Source: Ambulatory Visit | Attending: Radiation Oncology | Admitting: Radiation Oncology

## 2012-09-11 ENCOUNTER — Ambulatory Visit
Admission: RE | Admit: 2012-09-11 | Discharge: 2012-09-11 | Disposition: A | Discharge status: Home / Self Care | Payer: BC Managed Care – PPO | Source: Ambulatory Visit | Attending: Radiation Oncology | Admitting: Radiation Oncology

## 2012-09-12 ENCOUNTER — Encounter: Payer: Self-pay | Admitting: Radiation Oncology

## 2012-09-12 ENCOUNTER — Ambulatory Visit
Admission: RE | Admit: 2012-09-12 | Discharge: 2012-09-12 | Disposition: A | Discharge status: Home / Self Care | Payer: BC Managed Care – PPO | Source: Ambulatory Visit | Attending: Radiation Oncology | Admitting: Radiation Oncology

## 2012-09-12 VITALS — BP 113/68 | HR 71 | Temp 97.6°F | Resp 20 | Wt 116.9 lb

## 2012-09-12 DIAGNOSIS — C50912 Malignant neoplasm of unspecified site of left female breast: Secondary | ICD-10-CM

## 2012-09-12 NOTE — Progress Notes (Signed)
  Radiation Oncology         [phone] ________________________________  Name: Kim Woods MRN: [id number]  Date: 09/12/2012  DOB: 07-24-1960  Weekly Radiation Therapy Management  Current Dose: 36 Gy     Planned Dose:  63-64 Gy  Narrative . . . . . . . . The patient presents for routine under treatment assessment.                                                     The patient is without complaint except for some itching in the breast area as well as fatigue.  She she has cut back slightly with her work schedule. She has also noticed some nipple sensitivity.                                 Set-up films were reviewed.                                 The chart was checked. Physical Findings. . .  weight is 116 lb 14.4 oz (53.025 kg). Her oral temperature is 97.6 F (36.4 C). Her blood pressure is 113/68 and her pulse is 71. Her respiration is 20. . Weight essentially stable.  The left breast area shows mild to moderate erythema with some radiation dermatitis in the upper inner aspect. Impression . . . . . . . The patient is  tolerating radiation. Plan . . . . . . . . . . . . Continue treatment as planned.  ________________________________  -----------------------------------  Billie Lade, PhD, MD

## 2012-09-12 NOTE — Progress Notes (Signed)
Pt denies pain, loss of appetite, does have fatigue. She is applying Radiaplex to left breast tx area for some bright hyperpigmentation, few spots of radiation dermatitis. She states she had some itching over weekend, applied 1% cortisone cream.

## 2012-09-13 ENCOUNTER — Ambulatory Visit
Admission: RE | Admit: 2012-09-13 | Discharge: 2012-09-13 | Disposition: A | Discharge status: Home / Self Care | Payer: BC Managed Care – PPO | Source: Ambulatory Visit | Attending: Radiation Oncology | Admitting: Radiation Oncology

## 2012-09-14 ENCOUNTER — Ambulatory Visit
Admission: RE | Admit: 2012-09-14 | Discharge: 2012-09-14 | Disposition: A | Discharge status: Home / Self Care | Payer: BC Managed Care – PPO | Source: Ambulatory Visit | Attending: Radiation Oncology | Admitting: Radiation Oncology

## 2012-09-15 ENCOUNTER — Ambulatory Visit
Admission: RE | Admit: 2012-09-15 | Discharge: 2012-09-15 | Disposition: A | Discharge status: Home / Self Care | Payer: BC Managed Care – PPO | Source: Ambulatory Visit | Attending: Radiation Oncology | Admitting: Radiation Oncology

## 2012-09-15 DIAGNOSIS — C50912 Malignant neoplasm of unspecified site of left female breast: Secondary | ICD-10-CM

## 2012-09-15 MED ORDER — RADIAPLEXRX EX GEL
Freq: Once | CUTANEOUS | Status: AC
  Administered 2012-09-15: 14:00:00 via TOPICAL

## 2012-09-19 ENCOUNTER — Ambulatory Visit
Admission: RE | Admit: 2012-09-19 | Discharge: 2012-09-19 | Disposition: A | Discharge status: Home / Self Care | Payer: BC Managed Care – PPO | Source: Ambulatory Visit | Attending: Radiation Oncology | Admitting: Radiation Oncology

## 2012-09-19 ENCOUNTER — Encounter: Payer: Self-pay | Admitting: Radiation Oncology

## 2012-09-19 ENCOUNTER — Telehealth: Payer: Self-pay | Admitting: *Deleted

## 2012-09-19 VITALS — BP 110/76 | HR 63 | Temp 98.8°F | Resp 20 | Wt 116.7 lb

## 2012-09-19 DIAGNOSIS — C50912 Malignant neoplasm of unspecified site of left female breast: Secondary | ICD-10-CM

## 2012-09-19 NOTE — Progress Notes (Signed)
Pt c/o headache today, states she has noticed more frequent headaches recently. She takes Motrin, occasionally w/Sudafed w/good relief. She is fatigued. Applying Radiaplex to left breast for hyperpigmentation, uses Cortisone occasionally for itching.

## 2012-09-19 NOTE — Progress Notes (Signed)
  Radiation Oncology         [phone] ________________________________  Name: Apphia Cropley MRN: [id number]  Date: 09/19/2012  DOB: 09-Dec-1960  Weekly Radiation Therapy Management  Current Dose: 43.2 Gy     Planned Dose:  63-64 Gy  Narrative . . . . . . . . The patient presents for routine under treatment assessment.                                                     The patient is without complaint except for some fatigue and mild itching within the left breast area. The patient did sleep a lot over the long weekend.                                 Set-up films were reviewed.                                 The chart was checked. Physical Findings. . .  weight is 116 lb 11.2 oz (52.935 kg). Her oral temperature is 98.8 F (37.1 C). Her blood pressure is 110/76 and her pulse is 63. Her respiration is 20. . Weight essentially stable.  The left breast area shows some mild hyperpigmentation changes and erythema. There is no moist desquamation. Impression . . . . . . . The patient is  tolerating radiation. Plan . . . . . . . . . . . . Continue treatment as planned.  ________________________________  -----------------------------------  Billie Lade, PhD, MD

## 2012-09-19 NOTE — Telephone Encounter (Signed)
Genetic testing was approved by insurance and testing will be started today per [employer].  Please note a letter regarding this will be mailed as well.  She states she will call the patient to let her know.

## 2012-09-20 ENCOUNTER — Encounter: Payer: Self-pay | Admitting: Radiation Oncology

## 2012-09-20 ENCOUNTER — Ambulatory Visit
Admission: RE | Admit: 2012-09-20 | Discharge: 2012-09-20 | Disposition: A | Discharge status: Home / Self Care | Payer: BC Managed Care – PPO | Source: Ambulatory Visit | Attending: Radiation Oncology | Admitting: Radiation Oncology

## 2012-09-20 NOTE — Progress Notes (Signed)
    Department of Radiation Oncology  Phone:  [phone] Fax:        [phone]  Electron beam simulation note  Today the patient underwent additional planning for radiation therapy directed at the left breast. The patient's treatment planning CT scan was reviewed and she had set up of a custom electron cutout field encompassing the lumpectomy cavity in the upper outer quadrant of the left breast. Patient will be treated with 9 MeV. electrons prescribed to the 95% isodose line. To ensure adequate dose to the superficial aspects of the target area a 1 cm bolus will be placed daily during the patient's treatment. The patient will receive 7 additional treatments at 1.8 gray for an additional dose of 12.6 gray. A special port plan is requested for treatment.  -----------------------------------  Billie Lade, PhD, MD

## 2012-09-21 ENCOUNTER — Ambulatory Visit
Admission: RE | Admit: 2012-09-21 | Discharge: 2012-09-21 | Disposition: A | Discharge status: Home / Self Care | Payer: BC Managed Care – PPO | Source: Ambulatory Visit | Attending: Radiation Oncology | Admitting: Radiation Oncology

## 2012-09-22 ENCOUNTER — Ambulatory Visit
Admission: RE | Admit: 2012-09-22 | Discharge: 2012-09-22 | Disposition: A | Discharge status: Home / Self Care | Payer: BC Managed Care – PPO | Source: Ambulatory Visit | Attending: Radiation Oncology | Admitting: Radiation Oncology

## 2012-09-22 ENCOUNTER — Telehealth: Payer: Self-pay | Admitting: *Deleted

## 2012-09-22 VITALS — BP 126/78 | HR 70 | Temp 98.4°F | Resp 20 | Wt 116.0 lb

## 2012-09-22 DIAGNOSIS — C50919 Malignant neoplasm of unspecified site of unspecified female breast: Secondary | ICD-10-CM

## 2012-09-22 NOTE — Progress Notes (Signed)
Weekly Management Note Current Dose:30.6   Gy  Projected Dose: 32.4 Gy   Narrative:   The patient requested to be seen today for complaints of headaches for the past 2 weeks. These are increasing in frequency and severity. A headache woke her from sleep this morning. She has taken Advil in the past. She took I filled this morning 400 mg but a 2-3 hours for it to improve. She has a history of sinus headaches which she gets a couple of times a month. She describes the headache is starting at the middle aspect of her eye and over her eyebrow. She felt some nausea with this headache this morning. She denies any focal neurologic signs such as difficulties with speech, motor movement or vision. She reports being anemic several years ago secondary to heavy menses and having similar symptoms. She has no risk factors for anemia at this time that she knows of. She requested a CBC be drawn today.  Physical Findings: Weight: 116 lb (52.617 kg). Unchanged. She is alert and oriented x3. She has no cranial nerve deficits. She walks normally and in a straight line.  Impression:  Worsening headaches of unclear etiology  Plan:  I do not know what is causing the headaches. 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 It does concern me that they are increasing in severity and that they woke her from sleep this morning. I think the prudent thing would be to check a head CT to make sure she is not having some sort of bleed. She declined this as she thought that seemed to be too much. She is going to see how it goes over the weekend and see her primary care physician on Monday if possible or needed. She did state that if anything gets worse she will go to the emergency room. I discussed trying the CBC today but that really doesn't seem to be the correct place for workup of an outpatient headache. She agreed.

## 2012-09-22 NOTE — Progress Notes (Signed)
Pt seen in nursing after radiation treatment requesting to be seen by dr. She states she has had headaches past 1-2 weeks which are increasing in frequency. She states she has taken Advil 200 mg x 1 tab w/relief after a couple hours, but this morning the headache woke her up. She states it was on the right side of her head and very painful. She also felt nauseated w/headache. She took Advil 400 mg and had good relief after 2-3 hours. Pt denies hx allergies, migraines, other problems such as sore throat, earache, cough.  Pt states the only other time she has had headaches was when she was anemic from heavy menses. She took iron tabs at that time, no hx of blood transfusion. Pt requests to be seen by dr today. She is unsure whether she should have lab drawn today.

## 2012-09-22 NOTE — Telephone Encounter (Signed)
Patient called asking to speak with MD nurse,Mary, informed her Corrie Dandy was with a patient at present,  Could I help her?she stated "she is still having head aches every day and taking motrin prn, but this is unusual" she remembers in the past having head aches and being anemic, wonders if she should get blood work", asked if she wanted to see MD after treatment today and MD could assess her and would make that decision to do lab work or not, she stated that would be fine, will notify [facility] and treatment area to send patient to nursing after her tx today,8:42 AM

## 2012-09-24 ENCOUNTER — Ambulatory Visit: Payer: Self-pay | Admitting: Radiation Oncology

## 2012-09-24 ENCOUNTER — Ambulatory Visit: Payer: Self-pay | Admitting: Hematology and Oncology

## 2012-09-25 ENCOUNTER — Ambulatory Visit
Admission: RE | Admit: 2012-09-25 | Discharge: 2012-09-25 | Disposition: A | Discharge status: Home / Self Care | Payer: BC Managed Care – PPO | Source: Ambulatory Visit | Attending: Radiation Oncology | Admitting: Radiation Oncology

## 2012-09-26 ENCOUNTER — Ambulatory Visit
Admission: RE | Admit: 2012-09-26 | Discharge: 2012-09-26 | Disposition: A | Discharge status: Home / Self Care | Payer: BC Managed Care – PPO | Source: Ambulatory Visit | Attending: Radiation Oncology | Admitting: Radiation Oncology

## 2012-09-26 ENCOUNTER — Encounter: Payer: Self-pay | Admitting: Radiation Oncology

## 2012-09-26 VITALS — BP 119/74 | HR 63 | Temp 98.8°F | Resp 20 | Wt 115.3 lb

## 2012-09-26 DIAGNOSIS — C50912 Malignant neoplasm of unspecified site of left female breast: Secondary | ICD-10-CM

## 2012-09-26 NOTE — Progress Notes (Signed)
Pt denies pain, states she had slight HA on Sat but none since. She states she has not been nauseated since weekend. Pt applying Radiaplex to left breast treatment area for hyperpigmentation. She is fatigued, no loss of appetite. Pt has cut back on her work hours.

## 2012-09-26 NOTE — Progress Notes (Signed)
[facility]    Radiation Oncology [address] [place]     Maryln Gottron, M.D. [place], [place] [address]               Billie Lade, M.D., Ph.D. Phone: [phone]      Molli Hazard A. Kathrynn Running, M.D. Fax: [phone]      Radene Gunning, M.D., Ph.D.         Lurline Hare, M.D.         Grayland Jack, M.D Weekly Treatment Management Note  Name: Kim Woods     MRN: [id number]        CSN: [id number] Date: 09/26/2012      DOB: 09/04/1960  CC: Duncan Dull, MD         Darrick Huntsman    Status: Outpatient  Diagnosis: The encounter diagnosis was Carcinoma of left breast.  Current Dose: 52.2 Gy  Current Fraction: 29  Planned Dose: 63.0 Gy  Narrative: Desiraye Rolfson was seen today for weekly treatment management. The chart was checked and port films  were reviewed. She does have some fatigue as well as some itching and discomfort in the breast area. After last Friday the patient has had no further problems with headaches.  Ciprofloxacin and Nitrofurantoin monohyd macro  Current Outpatient Prescriptions  Medication Sig Dispense Refill  . azelastine (ASTELIN) 137 MCG/SPRAY nasal spray       . calcium citrate-vitamin D 200-200 MG-UNIT TABS Take 2 tablets by mouth daily.      . hyaluronate sodium (RADIAPLEXRX) GEL Apply topically 2 (two) times daily.      . vitamin C (ASCORBIC ACID) 500 MG tablet Take 500 mg by mouth daily.       No current facility-administered medications for this encounter.   Labs:  No results found for this basename: WBC, HGB, HCT, MCV, PLT   Lab Results  Component Value Date   CREATININE 0.86 10/06/2011   BUN 11 10/06/2011   NA 137 10/06/2011   K 4.2 10/06/2011   CL 102 10/06/2011   CO2 25 10/06/2011   Lab Results  Component Value Date   ALT 13 10/06/2011   AST 18 10/06/2011   BILITOT 0.7 10/06/2011    Physical Examination:  weight is 115 lb 4.8 oz (52.3 kg). Her oral temperature is 98.8 F (37.1 C). Her blood pressure is 119/74 and her pulse is 63. Her  respiration is 20.    Wt Readings from Last 3 Encounters:  09/26/12 115 lb 4.8 oz (52.3 kg)  09/22/12 116 lb (52.617 kg)  09/19/12 116 lb 11.2 oz (52.935 kg)    The left breast area shows some erythema and hyperpigmentation changes. The degree of erythema seems to be less this week. Lungs - Normal respiratory effort, chest expands symmetrically. Lungs are clear to auscultation, no crackles or wheezes.  Heart has regular rhythm and rate  Abdomen is soft and non tender with normal bowel sounds  Assessment:  Patient tolerating treatments well  Plan: Continue treatment per original radiation prescription

## 2012-09-27 ENCOUNTER — Ambulatory Visit
Admission: RE | Admit: 2012-09-27 | Discharge: 2012-09-27 | Disposition: A | Discharge status: Home / Self Care | Payer: BC Managed Care – PPO | Source: Ambulatory Visit | Attending: Radiation Oncology | Admitting: Radiation Oncology

## 2012-09-28 ENCOUNTER — Telehealth: Payer: Self-pay | Admitting: General Surgery

## 2012-09-28 ENCOUNTER — Ambulatory Visit
Admission: RE | Admit: 2012-09-28 | Discharge: 2012-09-28 | Disposition: A | Discharge status: Home / Self Care | Payer: BC Managed Care – PPO | Source: Ambulatory Visit | Attending: Radiation Oncology | Admitting: Radiation Oncology

## 2012-09-28 NOTE — Telephone Encounter (Signed)
The patient was notified that her BRCA testing was negative.  Arrangements are in place for followup after she completes whole breast radiation later this month.

## 2012-09-29 ENCOUNTER — Ambulatory Visit
Admission: RE | Admit: 2012-09-29 | Discharge: 2012-09-29 | Disposition: A | Discharge status: Home / Self Care | Payer: BC Managed Care – PPO | Source: Ambulatory Visit | Attending: Radiation Oncology | Admitting: Radiation Oncology

## 2012-10-02 ENCOUNTER — Ambulatory Visit
Admission: RE | Admit: 2012-10-02 | Discharge: 2012-10-02 | Disposition: A | Discharge status: Home / Self Care | Payer: BC Managed Care – PPO | Source: Ambulatory Visit | Attending: Radiation Oncology | Admitting: Radiation Oncology

## 2012-10-03 ENCOUNTER — Ambulatory Visit
Admission: RE | Admit: 2012-10-03 | Discharge: 2012-10-03 | Disposition: A | Discharge status: Home / Self Care | Payer: BC Managed Care – PPO | Source: Ambulatory Visit | Attending: Radiation Oncology | Admitting: Radiation Oncology

## 2012-10-03 VITALS — BP 92/55 | HR 62 | Temp 97.7°F | Ht 68.0 in | Wt 114.8 lb

## 2012-10-03 DIAGNOSIS — C50912 Malignant neoplasm of unspecified site of left female breast: Secondary | ICD-10-CM

## 2012-10-03 NOTE — Progress Notes (Signed)
[facility]    Radiation Oncology [address] [place] [address]     Maryln Gottron, M.D. [place], [place] [address]               Billie Lade, M.D., Ph.D. Phone: [phone]      Molli Hazard A. Kathrynn Running, M.D. Fax: [phone]      Radene Gunning, M.D., Ph.D.         Lurline Hare, M.D.         Grayland Jack, M.D Weekly Treatment Management Note  Name: Kim Woods     MRN: [id number]        CSN: [id number] Date: 10/03/2012      DOB: 1961-03-31  CC: Duncan Dull, MD         Darrick Huntsman    Status: Outpatient  Diagnosis: The encounter diagnosis was Carcinoma of left breast.  Current Dose: 61.2 Gy  Current Fraction: 34  Planned Dose: 63.0 Gy  Narrative: Kim Woods was seen today for weekly treatment management. The chart was checked and port films  were reviewed. She is doing well this week. She has less fatigue and has less itching or discomfort in the breast area.  Ciprofloxacin and Nitrofurantoin monohyd macro  Current Outpatient Prescriptions  Medication Sig Dispense Refill  . calcium citrate-vitamin D 200-200 MG-UNIT TABS Take 2 tablets by mouth daily.      . hyaluronate sodium (RADIAPLEXRX) GEL Apply topically 2 (two) times daily.      . vitamin C (ASCORBIC ACID) 500 MG tablet Take 500 mg by mouth daily.      [name] azelastine (ASTELIN) 137 MCG/SPRAY nasal spray        No current facility-administered medications for this encounter.   Labs:  No results found for this basename: WBC, HGB, HCT, MCV, PLT   Lab Results  Component Value Date   CREATININE 0.86 10/06/2011   BUN 11 10/06/2011   NA 137 10/06/2011   K 4.2 10/06/2011   CL 102 10/06/2011   CO2 25 10/06/2011   Lab Results  Component Value Date   ALT 13 10/06/2011   AST 18 10/06/2011   BILITOT 0.7 10/06/2011    Physical Examination:  height is 5\' 8"  (1.727 m) and weight is 114 lb 12.8 oz (52.073 kg). Her temperature is 97.7 F (36.5 C). Her blood pressure is 92/55 and her pulse is 62.    Wt Readings from Last 3  Encounters:  10/03/12 114 lb 12.8 oz (52.073 kg)  09/26/12 115 lb 4.8 oz (52.3 kg)  09/22/12 116 lb (52.617 kg)    The left breast area shows some hyperpigmentation changes and minimal erythema. There is no skin breakdown appreciated.                                                     Lungs are clear to auscultation, no crackles or wheezes.  Heart has regular rhythm and rate  Abdomen is soft and non tender with normal bowel sounds  Assessment:  Patient tolerating treatments well  Plan: Continue treatment per original radiation prescription.  She will complete her radiation therapy tomorrow.

## 2012-10-03 NOTE — Progress Notes (Signed)
Kim Woods here for weekly under treat visit.  She has had 24 fractions to her left breast.  She denies pain.  She does have fatigue.  She had a couple days of nausea 2 weekends ago.  The skin on her left breast is pink with hyperpigmentation.  She is using radiaplex.

## 2012-10-04 ENCOUNTER — Ambulatory Visit
Admission: RE | Admit: 2012-10-04 | Discharge: 2012-10-04 | Disposition: A | Discharge status: Home / Self Care | Payer: BC Managed Care – PPO | Source: Ambulatory Visit | Attending: Radiation Oncology | Admitting: Radiation Oncology

## 2012-10-11 ENCOUNTER — Encounter: Payer: Self-pay | Admitting: General Surgery

## 2012-10-11 ENCOUNTER — Encounter: Payer: Self-pay | Admitting: Radiation Oncology

## 2012-10-11 ENCOUNTER — Ambulatory Visit (INDEPENDENT_AMBULATORY_CARE_PROVIDER_SITE_OTHER): Payer: BC Managed Care – PPO | Admitting: General Surgery

## 2012-10-11 VITALS — HR 72 | Resp 12 | Ht 67.5 in | Wt 115.0 lb

## 2012-10-11 DIAGNOSIS — C50919 Malignant neoplasm of unspecified site of unspecified female breast: Secondary | ICD-10-CM

## 2012-10-11 DIAGNOSIS — C50912 Malignant neoplasm of unspecified site of left female breast: Secondary | ICD-10-CM

## 2012-10-11 NOTE — Progress Notes (Signed)
Patient ID: Kim Woods, female   DOB: 20-Oct-1960, 52 y.o.   MRN: [id number]  Chief Complaint  Patient presents with  . Breast Cancer Long Term Follow Up    HPI Tineka Uriegas is a 52 y.o. female who presents for a follow up breast cancer evaluation. The patient was diagnosed in 2014 with left breast invasive mammary carcinoma, T1b, N0 and a. No new problems with the breasts. She just completed radiation therapy last week.  HPI  Past Medical History  Diagnosis Date  . Stress fracture of foot   . Patient underweight   . Irritable bowel syndrome 2007    GI eval with colonoscopy [facility]  . Breast cancer     left breast invasive mammary carcinoma  . Asthma     precipitated by seasonal allergies     Past Surgical History  Procedure Laterality Date  . Endometrial ablation  Sept 2011    Novasure  . Sentinel lymph node biopsy Left 06/27/2012    incisional bx/lumpectomy  . Colonoscopy  2006    Dr. Mechele Collin  . Breast surgery  2004    left, normal age 58, incisional bx  . Lymph node biopsy Left 07/12/2012    Family History  Problem Relation Age of Onset  . Cancer Mother     uterine  . Mental illness Father 51    parkinsons disease  . Cancer Paternal Grandmother     ostesarcoma hip,     Social History History  Substance Use Topics  . Smoking status: Never Smoker   . Smokeless tobacco: Never Used  . Alcohol Use: Yes    Allergies  Allergen Reactions  . Ciprofloxacin Other (See Comments)    Numbness and tingling  . Nitrofurantoin Monohyd Macro     Numbness, tingling in hands and feet    Current Outpatient Prescriptions  Medication Sig Dispense Refill  . azelastine (ASTELIN) 137 MCG/SPRAY nasal spray       . calcium citrate-vitamin D 200-200 MG-UNIT TABS Take 2 tablets by mouth daily.      . hyaluronate sodium (RADIAPLEXRX) GEL Apply topically 2 (two) times daily.      . vitamin C (ASCORBIC ACID) 500 MG tablet Take 500 mg by mouth daily.       No current  facility-administered medications for this visit.    Review of Systems Review of Systems  Constitutional: Negative.   Respiratory: Negative.   Cardiovascular: Negative.     Pulse 72, resp. rate 12, height 5' 7.5" (1.715 m), weight 115 lb (52.164 kg).  Physical Exam Physical Exam  Constitutional: She appears well-developed and well-nourished.  Neck: Trachea normal. No mass and no thyromegaly present.  Cardiovascular: Normal rate, regular rhythm and normal heart sounds.   No murmur heard. Pulmonary/Chest: Effort normal and breath sounds normal. Right breast exhibits no inverted nipple, no mass, no nipple discharge, no skin change and no tenderness. Left breast exhibits no inverted nipple, no mass, no nipple discharge, no skin change and no tenderness.  Left breast well healed early radiation skin changes are evident.   Lymphadenopathy:    She has no cervical adenopathy.    She has no axillary adenopathy.  Neurological: She is alert.  Skin: Skin is warm and dry.    Data Reviewed Radiation therapy completion note reviewed.  Assessment    Well status post treatment of an early left breast cancer.     Plan    The patient will discuss antiestrogen therapy with Dr. Wendie Simmer. We'll  arrange for a follow up exam in left breast mammogram in 6 months.       Earline Mayotte 10/12/2012, 8:21 PM

## 2012-10-11 NOTE — Progress Notes (Signed)
  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  The patient received 10 treatments at the [facility] prior to transferring her treatment to [facility].  Site/dose:   Right breast 50.4 Gy in 28 fractions. The site of presentation in the upper outer aspect of the left breast was boosted to a cumulative dose of 63 Gy.  Beams/energy:   The patient was treated with tangential beams encompassing the left breast. Forward planning was used to improve the dose homogeneity. The lumpectomy cavity was boosted with a custom electron cutout field using 9 MeV electrons prescribed to the 95% isodose line. To ensure adequate dose to the superficial aspects of the target area a 1 cm bolus was placed daily during the patient's electron treatments.  Narrative: The patient tolerated radiation treatment relatively well.   She did have some itching and discomfort in the breast area as well as some fatigue towards the end of her therapy. She did not experience any moist desquamation during the course for treatment.  Plan: The patient has completed radiation treatment. The patient will return to radiation oncology clinic for routine followup in one month. I advised them to call or return sooner if they have any questions or concerns related to their recovery or treatment.  -----------------------------------  Billie Lade, PhD, MD

## 2012-10-12 ENCOUNTER — Encounter: Payer: Self-pay | Admitting: General Surgery

## 2012-10-13 ENCOUNTER — Telehealth: Payer: Self-pay | Admitting: Dietician

## 2012-10-13 LAB — CANCER ANTIGEN 27.29: CA 27.29: 10.4 U/mL (ref 0.0–38.6)

## 2012-10-13 NOTE — Telephone Encounter (Signed)
Brief Outpatient Oncology Nutrition Note  Patient has been identified to be at risk on malnutrition screen.  Wt Readings from Last 10 Encounters:  10/11/12 115 lb (52.164 kg)  10/03/12 114 lb 12.8 oz (52.073 kg)  09/26/12 115 lb 4.8 oz (52.3 kg)  09/22/12 116 lb (52.617 kg)  09/19/12 116 lb 11.2 oz (52.935 kg)  09/12/12 116 lb 14.4 oz (53.025 kg)  09/05/12 116 lb 14.4 oz (53.025 kg)  08/02/12 115 lb (52.164 kg)  07/17/12 116 lb (52.617 kg)  06/21/12 117 lb 4 oz (53.184 kg)    Called patient due to trigger for weight loss on malnutrition screen report.  Patient reports being chronically underweight.  Eats 6 small meals/snacks daily.  Lactose intolerant and some mild IBS symptoms per patient.  Finished XRT for Hormone sensitive breast cancer last week.  Discussed her nutrition and tips to gain weight.  Based on diet hx, patient needs to increase caloric intake with recommendations to increase meal size and not restrict CHO.  Patient with further questions and would like an appointment with RD.  Will request.  Oran Rein, RD, LDN

## 2012-10-18 ENCOUNTER — Encounter: Payer: Self-pay | Admitting: General Surgery

## 2012-10-24 ENCOUNTER — Ambulatory Visit: Payer: Self-pay | Admitting: Hematology and Oncology

## 2012-10-24 ENCOUNTER — Ambulatory Visit: Payer: Self-pay | Admitting: Radiation Oncology

## 2012-11-10 ENCOUNTER — Encounter: Payer: Self-pay | Admitting: Radiation Oncology

## 2012-11-10 DIAGNOSIS — Z923 Personal history of irradiation: Secondary | ICD-10-CM | POA: Insufficient documentation

## 2012-11-13 ENCOUNTER — Ambulatory Visit
Admission: RE | Admit: 2012-11-13 | Discharge: 2012-11-13 | Disposition: A | Discharge status: Home / Self Care | Payer: BC Managed Care – PPO | Source: Ambulatory Visit | Attending: Radiation Oncology | Admitting: Radiation Oncology

## 2012-11-13 ENCOUNTER — Encounter: Payer: Self-pay | Admitting: Radiation Oncology

## 2012-11-13 VITALS — BP 103/52 | HR 62 | Temp 97.8°F | Ht 67.5 in | Wt 116.8 lb

## 2012-11-13 DIAGNOSIS — C50912 Malignant neoplasm of unspecified site of left female breast: Secondary | ICD-10-CM

## 2012-11-13 NOTE — Progress Notes (Signed)
Kim Woods here for follow up after treatment to her left breast.  She denies pain.  She does have fatigue.  The skin on her left breast has hyperpigmentation and some peeling.

## 2012-11-13 NOTE — Progress Notes (Signed)
  Radiation Oncology         [phone]) [phone] ________________________________  Name: Kim Woods MRN: [id number]  Date: 11/13/2012  DOB: 1960-12-13  Follow-Up Visit Note  CC: Duncan Dull, MD  Sherlene Shams, MD  Diagnosis:   Stage I tubular carcinoma of the left breast  Interval Since Last Radiation:  6  weeks  Narrative:  The patient returns today for routine follow-up.  She continues to have some fatigue but otherwise is doing well. She denies any pain within the breast area, nipple discharge or bleeding. She has started letrozole and seems to be tolerating this well.                               ALLERGIES:  is allergic to ciprofloxacin and nitrofurantoin monohyd macro.  Meds: Current Outpatient Prescriptions  Medication Sig Dispense Refill  . calcium citrate-vitamin D 200-200 MG-UNIT TABS Take 2 tablets by mouth daily.      [name] letrozole (FEMARA) 2.5 MG tablet Take 2.5 mg by mouth daily.       [name] azelastine (ASTELIN) 137 MCG/SPRAY nasal spray       . hyaluronate sodium (RADIAPLEXRX) GEL Apply topically 2 (two) times daily.      . vitamin C (ASCORBIC ACID) 500 MG tablet Take 500 mg by mouth daily.       No current facility-administered medications for this encounter.    Physical Findings: The patient is in no acute distress. Patient is alert and oriented.  height is 5' 7.5" (1.715 m) and weight is 116 lb 12.8 oz (52.98 kg). Her temperature is 97.8 F (36.6 C). Her blood pressure is 103/52 and her pulse is 62. [name]  No palpable supraclavicular or axillary adenopathy the lungs are clear to auscultation. The heart has a regular rhythm and rate. Examination of the left breast area reveals some hyperpigmentation changes. There is mild edema in the breast. There is no dominant mass appreciated in the breast nipple discharge or bleeding.  Lab Findings: No results found for this basename: WBC, HGB, HCT, MCV, PLT      Impression:  The patient is recovering from the effects of radiation. No  evidence of recurrence on clinical exam today.  Plan:  Routine followup in 6 months  _____________________________________  -----------------------------------  Billie Lade, PhD, MD

## 2012-11-24 ENCOUNTER — Ambulatory Visit: Payer: Self-pay | Admitting: Internal Medicine

## 2012-11-24 LAB — COMPREHENSIVE METABOLIC PANEL
Alkaline Phosphatase: 71 U/L (ref 50–136)
Anion Gap: 6 — ABNORMAL LOW (ref 7–16)
BUN: 19 mg/dL — ABNORMAL HIGH (ref 7–18)
Bilirubin,Total: 0.6 mg/dL (ref 0.2–1.0)
Calcium, Total: 9.2 mg/dL (ref 8.5–10.1)
Creatinine: 1.08 mg/dL (ref 0.60–1.30)
EGFR (African American): >60
EGFR (Non-African Amer.): 60 — ABNORMAL LOW
Osmolality: 280 (ref 275–301)
Potassium: 4 mmol/L (ref 3.5–5.1)
SGOT(AST): 15 U/L (ref 15–37)
SGPT (ALT): 23 U/L (ref 12–78)

## 2012-11-24 LAB — HEPATIC FUNCTION PANEL
ALT: 23 U/L (ref 7–35)
AST: 15 U/L (ref 13–35)
Alkaline Phosphatase: 71 U/L (ref 25–125)
Bilirubin, Total: 0.6 mg/dL

## 2012-11-24 LAB — CBC AND DIFFERENTIAL
Hemoglobin: 13.3 g/dL (ref 12.0–16.0)
Platelets: 231 10*3/uL (ref 150–399)

## 2012-12-04 ENCOUNTER — Ambulatory Visit (INDEPENDENT_AMBULATORY_CARE_PROVIDER_SITE_OTHER): Payer: BC Managed Care – PPO | Admitting: Adult Health

## 2012-12-04 ENCOUNTER — Encounter: Payer: Self-pay | Admitting: Adult Health

## 2012-12-04 VITALS — BP 122/72 | HR 88 | Resp 12 | Wt 116.5 lb

## 2012-12-04 DIAGNOSIS — B379 Candidiasis, unspecified: Secondary | ICD-10-CM

## 2012-12-04 DIAGNOSIS — N760 Acute vaginitis: Secondary | ICD-10-CM

## 2012-12-04 NOTE — Assessment & Plan Note (Signed)
Wet prep. Suspect BV based on appearance. Wait until results prior to prescribing medication. Patient was in agreement.

## 2012-12-04 NOTE — Progress Notes (Signed)
  Subjective:    Patient ID: Kim Woods, female    DOB: 1960/08/11, 52 y.o.   MRN: [id number]  HPI  Patient is a pleasant 52 year old female who presents to clinic with concerns that she may have a yeast infection. She reports starting Replens for vaginal dryness. She is not certain if this is just coincidental, but shortly after starting Replens is when she began to experience symptoms. She reports a thick, white discharge.  Current Outpatient Prescriptions on File Prior to Visit  Medication Sig Dispense Refill  . azelastine (ASTELIN) 137 MCG/SPRAY nasal spray       . calcium citrate-vitamin D 200-200 MG-UNIT TABS Take 2 tablets by mouth daily.      . hyaluronate sodium (RADIAPLEXRX) GEL Apply topically 2 (two) times daily.      . vitamin C (ASCORBIC ACID) 500 MG tablet Take 500 mg by mouth daily.      [name] letrozole (FEMARA) 2.5 MG tablet Take 2.5 mg by mouth daily.        No current facility-administered medications on file prior to visit.    Review of Systems  Genitourinary: Positive for vaginal discharge. Negative for vaginal pain and pelvic pain.       No itching       Objective:   Physical Exam  Constitutional: She is oriented to person, place, and time.  Genitourinary: Vaginal discharge found.  Thick, white discharge at vaginal opening.   Neurological: She is alert and oriented to person, place, and time.  Psychiatric: She has a normal mood and affect. Her behavior is normal. Judgment and thought content normal.          Assessment & Plan:

## 2012-12-05 LAB — WET PREP BY MOLECULAR PROBE
Candida species: NEGATIVE
Trichomonas vaginosis: NEGATIVE

## 2012-12-06 ENCOUNTER — Other Ambulatory Visit: Payer: Self-pay | Admitting: Adult Health

## 2012-12-06 ENCOUNTER — Telehealth: Payer: Self-pay | Admitting: *Deleted

## 2012-12-06 MED ORDER — METRONIDAZOLE 500 MG PO TABS: ORAL_TABLET | ORAL | Status: DC

## 2012-12-06 MED ORDER — METRONIDAZOLE 0.75 % VA GEL: 1.0000 | Freq: Two times a day (BID) | VAGINAL | Status: DC

## 2012-12-06 NOTE — Telephone Encounter (Signed)
Reported vaginal culture to pt. Requests Metrogel rather than oral Metronidazole. Ok per [employer], new Rx sent to pharmacy. Pt notified.

## 2012-12-15 ENCOUNTER — Encounter: Payer: Self-pay | Admitting: Internal Medicine

## 2012-12-15 ENCOUNTER — Ambulatory Visit (INDEPENDENT_AMBULATORY_CARE_PROVIDER_SITE_OTHER): Payer: BC Managed Care – PPO | Admitting: Internal Medicine

## 2012-12-15 VITALS — BP 100/58 | HR 95 | Temp 97.6°F | Resp 14 | Wt 116.0 lb

## 2012-12-15 DIAGNOSIS — Z0000 Encounter for general adult medical examination without abnormal findings: Secondary | ICD-10-CM

## 2012-12-15 DIAGNOSIS — N760 Acute vaginitis: Secondary | ICD-10-CM

## 2012-12-15 DIAGNOSIS — R636 Underweight: Secondary | ICD-10-CM

## 2012-12-15 DIAGNOSIS — C50912 Malignant neoplasm of unspecified site of left female breast: Secondary | ICD-10-CM

## 2012-12-15 DIAGNOSIS — C50919 Malignant neoplasm of unspecified site of unspecified female breast: Secondary | ICD-10-CM

## 2012-12-15 NOTE — Progress Notes (Signed)
Patient ID: Kim Woods, female   DOB: 15-Jan-1961, 52 y.o.   MRN: [id number]    Subjective:     Kim Woods is a 52 y.o. female and is here for a comprehensive physical exam. The patient reports problems - with vaginal irritation and dryness.[name]  History   Social History  . Marital Status: Married    Spouse Name: N/A    Number of Children: N/A  . Years of Education: N/A   Occupational History  . Not on file.   Social History Main Topics  . Smoking status: Never Smoker   . Smokeless tobacco: Never Used  . Alcohol Use: 4.2 oz/week    7 Glasses of wine per week  . Drug Use: No  . Sexual Activity: Yes     Comment: menarche age 88, P59, HRT x 3 mos -patch, cream   Other Topics Concern  . Not on file   Social History Narrative  . No narrative on file   Health Maintenance  Topic Date Due  . Tetanus/tdap  05/27/1981  . Influenza Vaccine  12/25/2012  . Pap Smear  06/25/2014  . Colonoscopy  06/25/2014  . Mammogram  07/08/2014    The following portions of the patient's history were reviewed and updated as appropriate: allergies, current medications, past family history, past medical history, past social history, past surgical history and problem list.  Review of Systems A comprehensive review of systems was negative.   Objective:   BP 100/58  Pulse 95  Temp(Src) 97.6 F (36.4 C) (Oral)  Resp 14  Wt 116 lb (52.617 kg)  BMI 17.89 kg/m2  SpO2 99%  General Appearance:    Alert, cooperative, no distress, appears stated age  Head:    Normocephalic, without obvious abnormality, atraumatic  Eyes:    PERRL, conjunctiva/corneas clear, EOM's intact, fundi    benign, both eyes  Ears:    Normal TM's and external ear canals, both ears  Nose:   Nares normal, septum midline, mucosa normal, no drainage    or sinus tenderness  Throat:   Lips, mucosa, and tongue normal; teeth and gums normal  Neck:   Supple, symmetrical, trachea midline, no adenopathy;    thyroid:  no  enlargement/tenderness/nodules; no carotid   bruit or JVD  Back:     Symmetric, no curvature, ROM normal, no CVA tenderness  Lungs:     Clear to auscultation bilaterally, respirations unlabored  Chest Wall:    No tenderness or deformity   Heart:    Regular rate and rhythm, S1 and S2 normal, no murmur, rub   or gallop  Breast Exam:    deferred  Abdomen:     Soft, non-tender, bowel sounds active all four quadrants,    no masses, no organomegaly  Genitalia:    Pelvic: cervix normal in appearance, external genitalia normal, no adnexal masses or tenderness, no cervical motion tenderness, rectovaginal septum normal, uterus normal size, shape, and consistency and vagina normal with scant discharge  Extremities:   Extremities normal, atraumatic, no cyanosis or edema  Pulses:   2+ and symmetric all extremities  Skin:   Skin color, texture, turgor normal, no rashes or lesions  Lymph nodes:   Cervical, supraclavicular, and axillary nodes normal  Neurologic:   CNII-XII intact, normal strength, sensation and reflexes    throughout     Assessment:   Patient underweight Weight is stable,  Has been referred to nutritional counselling.   Vaginitis and vulvovaginitis Wet prep and culture was done  today.  Carcinoma of left breast    Breast cancer, left breast Breast conservation therapy done by JB.  Rad Onc by Dr Roselind Messier.  Contiue Letrazole.   Routine general medical examination at a health care facility Annual comprehensive exam was done excluding breast. All screenings have been addressed .    Updated Medication List Outpatient Encounter Prescriptions as of 12/15/2012  Medication Sig Dispense Refill  . azelastine (ASTELIN) 137 MCG/SPRAY nasal spray       . calcium citrate-vitamin D 200-200 MG-UNIT TABS Take 2 tablets by mouth daily.      [name] letrozole (FEMARA) 2.5 MG tablet Take 2.5 mg by mouth daily.       . vitamin C (ASCORBIC ACID) 500 MG tablet Take 500 mg by mouth daily.      .  [DISCONTINUED] hyaluronate sodium (RADIAPLEXRX) GEL Apply topically 2 (two) times daily.      . [DISCONTINUED] metroNIDAZOLE (METROGEL) 0.75 % vaginal gel Place 1 Applicatorful vaginally 2 (two) times daily. For 5 days  70 g  0   No facility-administered encounter medications on file as of 12/15/2012.

## 2012-12-17 ENCOUNTER — Encounter: Payer: Self-pay | Admitting: Internal Medicine

## 2012-12-17 DIAGNOSIS — Z0000 Encounter for general adult medical examination without abnormal findings: Secondary | ICD-10-CM | POA: Insufficient documentation

## 2012-12-17 NOTE — Assessment & Plan Note (Signed)
Wet prep and culture was done today.

## 2012-12-17 NOTE — Assessment & Plan Note (Signed)
Weight is stable,  Has been referred to nutritional counselling.

## 2012-12-17 NOTE — Assessment & Plan Note (Signed)
Breast conservation therapy done by JB.  Rad Onc by Dr Roselind Messier.  Contiue Letrazole.

## 2012-12-17 NOTE — Assessment & Plan Note (Signed)
Annual comprehensive exam was done excluding breast. All screenings have been addressed .

## 2012-12-19 ENCOUNTER — Other Ambulatory Visit: Payer: Self-pay | Admitting: Internal Medicine

## 2012-12-20 LAB — PAP IG W/ RFLX HPV ASCU

## 2012-12-21 ENCOUNTER — Encounter: Payer: Self-pay | Admitting: *Deleted

## 2012-12-22 ENCOUNTER — Telehealth: Payer: Self-pay | Admitting: Internal Medicine

## 2012-12-22 NOTE — Telephone Encounter (Signed)
Called and advised patient of results

## 2012-12-22 NOTE — Telephone Encounter (Signed)
Pt calling for results of culture and pap.

## 2012-12-24 ENCOUNTER — Encounter: Payer: Self-pay | Admitting: Internal Medicine

## 2012-12-24 DIAGNOSIS — C50912 Malignant neoplasm of unspecified site of left female breast: Secondary | ICD-10-CM

## 2012-12-24 DIAGNOSIS — Z853 Personal history of malignant neoplasm of breast: Secondary | ICD-10-CM | POA: Insufficient documentation

## 2012-12-25 ENCOUNTER — Ambulatory Visit: Payer: Self-pay | Admitting: Internal Medicine

## 2012-12-25 ENCOUNTER — Ambulatory Visit: Payer: Self-pay | Admitting: Hematology and Oncology

## 2012-12-27 ENCOUNTER — Encounter: Payer: Self-pay | Admitting: Internal Medicine

## 2012-12-31 MED ORDER — DERMACLOUD EX CREA: TOPICAL_CREAM | CUTANEOUS | Status: DC

## 2013-01-02 ENCOUNTER — Ambulatory Visit: Payer: Self-pay | Admitting: General Surgery

## 2013-01-02 LAB — CBC CANCER CENTER
Eosinophil #: 0.1 x10 3/mm (ref 0.0–0.7)
Eosinophil %: 0.8 %
HCT: 41.1 % (ref 35.0–47.0)
HGB: 14.1 g/dL (ref 12.0–16.0)
Lymphocyte %: 21.3 %
MCH: 31.7 pg (ref 26.0–34.0)
MCHC: 34.3 g/dL (ref 32.0–36.0)
MCV: 93 fL (ref 80–100)
Monocyte %: 5.8 %
Neutrophil #: 5.2 x10 3/mm (ref 1.4–6.5)
Neutrophil %: 71.5 %
WBC: 7.2 x10 3/mm (ref 3.6–11.0)

## 2013-01-02 LAB — COMPREHENSIVE METABOLIC PANEL
Albumin: 4.2 g/dL (ref 3.4–5.0)
Alkaline Phosphatase: 78 U/L (ref 50–136)
Anion Gap: 6 — ABNORMAL LOW (ref 7–16)
BUN: 18 mg/dL (ref 7–18)
Bilirubin,Total: 0.5 mg/dL (ref 0.2–1.0)
Chloride: 101 mmol/L (ref 98–107)
Creatinine: 1.11 mg/dL (ref 0.60–1.30)
EGFR (African American): >60
Osmolality: 278 (ref 275–301)
Potassium: 3.7 mmol/L (ref 3.5–5.1)
SGOT(AST): 18 U/L (ref 15–37)
SGPT (ALT): 24 U/L (ref 12–78)
Total Protein: 8.4 g/dL — ABNORMAL HIGH (ref 6.4–8.2)

## 2013-01-04 ENCOUNTER — Encounter: Payer: Self-pay | Admitting: General Surgery

## 2013-01-15 ENCOUNTER — Ambulatory Visit (INDEPENDENT_AMBULATORY_CARE_PROVIDER_SITE_OTHER): Payer: BC Managed Care – PPO | Admitting: General Surgery

## 2013-01-15 ENCOUNTER — Encounter: Payer: Self-pay | Admitting: General Surgery

## 2013-01-15 VITALS — BP 90/68 | HR 76 | Resp 12 | Ht 67.5 in | Wt 114.0 lb

## 2013-01-15 DIAGNOSIS — C50919 Malignant neoplasm of unspecified site of unspecified female breast: Secondary | ICD-10-CM

## 2013-01-15 DIAGNOSIS — C50912 Malignant neoplasm of unspecified site of left female breast: Secondary | ICD-10-CM

## 2013-01-15 NOTE — Progress Notes (Signed)
Patient ID: Kim Woods, female   DOB: [birth year], 52 y.o.   MRN: [id number]  Chief Complaint  Patient presents with  . Follow-up    mammogram    HPI Kim Woods is a 52 y.o. female who presents for a breast evaluation. The most recent left breast mammogram was done on 01/02/13.Patient does perform regular self breast checks and gets regular mammograms done.  The patient denies any new problems with the breasts at this time. She is currently discontinued her Femara due to side effects (vaginal dryness) but does plan to start back at some point. She states she is having night sweats more often then she has in the last 8 months.   HPI  Past Medical History  Diagnosis Date  . Stress fracture of foot   . Patient underweight   . Irritable bowel syndrome 2007    GI eval with colonoscopy [facility]  . Breast cancer     left breast invasive mammary carcinoma  . Asthma     precipitated by seasonal allergies   . Hx of radiation therapy 08/30/12- 10/04/12    left breast 50.4 gy 28 fx, upper outer aspect  boosted 63 gy    Past Surgical History  Procedure Laterality Date  . Endometrial ablation  Sept 2011    Novasure  . Sentinel lymph node biopsy Left 06/27/2012    incisional bx/lumpectomy  . Colonoscopy  2006    Dr. Mechele Collin  . Breast surgery  2004    left, normal age 40, incisional bx  . Lymph node biopsy Left 07/12/2012    Family History  Problem Relation Age of Onset  . Cancer Mother     uterine  . Mental illness Father 15    parkinsons disease  . Cancer Paternal Grandmother     ostesarcoma hip,     Social History History  Substance Use Topics  . Smoking status: Never Smoker   . Smokeless tobacco: Never Used  . Alcohol Use: 4.2 oz/week    7 Glasses of wine per week    Allergies  Allergen Reactions  . Ciprofloxacin Other (See Comments)    Numbness and tingling  . Nitrofurantoin Monohyd Macro     Numbness, tingling in hands and feet    Current Outpatient Prescriptions   Medication Sig Dispense Refill  . calcium citrate-vitamin D 200-200 MG-UNIT TABS Take 2 tablets by mouth daily.      [name] ESTRACE VAGINAL 0.1 MG/GM vaginal cream 1 Applicatorful as needed.      . vitamin C (ASCORBIC ACID) 500 MG tablet Take 500 mg by mouth daily.       No current facility-administered medications for this visit.    Review of Systems Review of Systems  Constitutional: Negative.   Respiratory: Negative.   Cardiovascular: Negative.     Blood pressure 90/68, pulse 76, resp. rate 12, height 5' 7.5" (1.715 m), weight 114 lb (51.71 kg).  Physical Exam Physical Exam  Constitutional: She is oriented to person, place, and time. She appears well-developed and well-nourished.  Neck: No thyromegaly present.  Cardiovascular: Normal rate, regular rhythm and normal heart sounds.   No murmur heard. Pulmonary/Chest: Effort normal and breath sounds normal. Right breast exhibits no inverted nipple, no mass, no nipple discharge, no skin change and no tenderness. Left breast exhibits no inverted nipple, no mass, no nipple discharge, no skin change and no tenderness.  Well healed scar on the upper outer quadrant and axilla of the left breast. No discernible volume  loss in the left breast. Normal skin thickening related to her whole breast radiation.  Lymphadenopathy:    She has no cervical adenopathy.    She has no axillary adenopathy.  Neurological: She is alert and oriented to person, place, and time.  Skin: Skin is warm and dry.    Data Reviewed Left breast mammogram dated 01/02/2013 was reviewed. No interval change and scattered microcalcifications. Scant scarring. BI-RAD-2.  Assessment    Doing well status post management of early stage invasive invasive malignancy of the left breast.  Significant vasomotor symptoms. Significant vaginal dryness improving with the use of topical estrogen cream.    Plan    Will arrange for followup mammograms in 6 months.  The patient is  being followed by medical oncology. They will make adjustments as indicated in regards to the antiestrogen therapy. The patient made need to consider tamoxifen and placement aromatase inhibitor if she continues to have severe symptoms (Aromasin producing tingling in the digits) and Femara producing announced vaginal dryness.       Kim Woods F 01/15/2013, 4:48 PM

## 2013-01-15 NOTE — Patient Instructions (Addendum)
Patient to return in 6 months with left breast mammogram.

## 2013-01-16 ENCOUNTER — Encounter: Payer: Self-pay | Admitting: General Surgery

## 2013-01-24 ENCOUNTER — Ambulatory Visit: Payer: Self-pay | Admitting: Hematology and Oncology

## 2013-01-24 ENCOUNTER — Ambulatory Visit: Payer: Self-pay | Admitting: Internal Medicine

## 2013-03-01 ENCOUNTER — Ambulatory Visit: Payer: Self-pay | Admitting: Hematology and Oncology

## 2013-03-01 ENCOUNTER — Other Ambulatory Visit: Payer: Self-pay

## 2013-03-01 LAB — COMPREHENSIVE METABOLIC PANEL
Alkaline Phosphatase: 66 U/L (ref 50–136)
Anion Gap: 2 — ABNORMAL LOW (ref 7–16)
BUN: 18 mg/dL (ref 7–18)
Bilirubin,Total: 0.5 mg/dL (ref 0.2–1.0)
Calcium, Total: 9 mg/dL (ref 8.5–10.1)
Chloride: 105 mmol/L (ref 98–107)
Co2: 29 mmol/L (ref 21–32)
Creatinine: 1.04 mg/dL (ref 0.60–1.30)
EGFR (Non-African Amer.): >60
Glucose: 96 mg/dL (ref 65–99)
Osmolality: 274 (ref 275–301)
SGOT(AST): 25 U/L (ref 15–37)
Sodium: 136 mmol/L (ref 136–145)

## 2013-03-01 LAB — CBC CANCER CENTER
Basophil #: 0 x10 3/mm (ref 0.0–0.1)
Basophil %: 0.6 %
HCT: 40.3 % (ref 35.0–47.0)
Lymphocyte %: 27.8 %
MCHC: 33.2 g/dL (ref 32.0–36.0)
MCV: 94 fL (ref 80–100)
Monocyte #: 0.6 x10 3/mm (ref 0.2–0.9)
Monocyte %: 9.4 %
Neutrophil #: 3.7 x10 3/mm (ref 1.4–6.5)
Platelet: 191 x10 3/mm (ref 150–440)
RBC: 4.3 10*6/uL (ref 3.80–5.20)
RDW: 11.9 % (ref 11.5–14.5)

## 2013-03-26 ENCOUNTER — Ambulatory Visit: Payer: Self-pay | Admitting: Hematology and Oncology

## 2013-05-03 ENCOUNTER — Ambulatory Visit (INDEPENDENT_AMBULATORY_CARE_PROVIDER_SITE_OTHER): Payer: BC Managed Care – PPO | Admitting: Podiatrist

## 2013-05-03 ENCOUNTER — Encounter: Payer: Self-pay | Admitting: Podiatrist

## 2013-05-03 VITALS — BP 112/71 | HR 61 | Resp 16 | Ht 67.5 in | Wt 120.0 lb

## 2013-05-03 DIAGNOSIS — B351 Tinea unguium: Secondary | ICD-10-CM

## 2013-05-03 MED ORDER — EFINACONAZOLE 10 % EX SOLN: 1.0000 [drp] | Freq: Every day | CUTANEOUS | Status: DC

## 2013-05-03 NOTE — Patient Instructions (Signed)
You will receive a call from [facility] for the Jublia medication and they will send it to you directly.

## 2013-05-03 NOTE — Progress Notes (Signed)
   Subjective:    Patient ID: Kim Woods, female    DOB: 1961/01/28, 53 y.o.   MRN: [id number]  HPI Comments: Toenail fungus on the left great toenail that started in July with just a little spot , [facility] and [facility] prescribed ciclopirox , it seemed to work a little bit , it  Grew out some and in the last two months seem to have got worse .     Review of Systems  HENT:       Ringing in ears   Skin:       Change in nails   All other systems reviewed and are negative.       Objective:   Physical Exam GENERAL APPEARANCE: Alert, conversant. Appropriately groomed. No acute distress.  VASCULAR: Pedal pulses palpable and strong bilateral.  Capillary refill time is immediate to all digits,  Proximal to distal cooling it warm to warm.  Digital hair growth is present bilateral  NEUROLOGIC: sensation is intact epicritically and protectively to 5.07 monofilament at 5/5 sites bilateral.  Light touch is intact bilateral, vibratory sensation intact bilateral, achilles tendon reflex is intact bilateral.  MUSCULOSKELETAL: acceptable muscle strength, tone and stability bilateral.  Intrinsic muscluature intact bilateral.  Rectus appearance of foot and digits noted bilateral.   DERMATOLOGIC: Left hallux nail has an area of yellowish brownish creaking on the medial aspect of the nail itself. It does appeared be fungal in nature and extends from the distal portion the nail proximally.     Assessment & Plan:  Onychomycosis x1 toenail Plan: Switch the patient from ciclopirox to Jublia nail therapy and this prescription was called in for her at [facility] pharmacy.  We discussed oral therapy and the patient is not interested in any type of oral therapy. We then discussed laser therapy and she would like to try topical prior to considering laser. She'll try the topical therapy for 6 months and if there is no improvement in symptoms she'll call and we'll consider laser therapy at that time

## 2013-05-14 ENCOUNTER — Telehealth: Payer: Self-pay | Admitting: *Deleted

## 2013-05-14 ENCOUNTER — Ambulatory Visit
Admission: RE | Admit: 2013-05-14 | Discharge: 2013-05-14 | Disposition: A | Discharge status: Home / Self Care | Payer: BC Managed Care – PPO | Source: Ambulatory Visit | Attending: Radiation Oncology | Admitting: Radiation Oncology

## 2013-05-14 ENCOUNTER — Encounter: Payer: Self-pay | Admitting: Radiation Oncology

## 2013-05-14 VITALS — BP 99/48 | HR 71 | Temp 97.5°F | Ht 67.5 in | Wt 118.9 lb

## 2013-05-14 DIAGNOSIS — C50919 Malignant neoplasm of unspecified site of unspecified female breast: Secondary | ICD-10-CM

## 2013-05-14 NOTE — Progress Notes (Signed)
Kim Woods here for follow up after treatment to her left breast.  She denies pain but does have occasional soreness under her left arm if she lifts anything heavy.  She is taking letrozole and uses estrace cream for the side effects.  She has slight fatigue. The skin on her left breast and underarm is intact.

## 2013-05-14 NOTE — Progress Notes (Signed)
  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  She is doing well. She denies any pain within the left breast nipple discharge or bleeding. She continues on Femara and is tolerating this well. She continues to see her surgeon and medical oncologist.   she did undergo mammography in September showing no suspicious areas in either breast.                              ALLERGIES:  is allergic to ciprofloxacin and nitrofurantoin monohyd macro.  Meds: Current Outpatient Prescriptions  Medication Sig Dispense Refill  . calcium citrate-vitamin D 200-200 MG-UNIT TABS Take 2 tablets by mouth daily.      . Efinaconazole 10 % SOLN Apply 1 drop topically daily.  4 mL  2  . ESTRACE VAGINAL 0.1 MG/GM vaginal cream 1 Applicatorful as needed.      [name] letrozole (FEMARA) 2.5 MG tablet       . Zinc 30 MG TABS Take by mouth.      . vitamin C (ASCORBIC ACID) 500 MG tablet Take 500 mg by mouth daily.       No current facility-administered medications for this encounter.    Physical Findings: The patient is in no acute distress. Patient is alert and oriented.  height is 5' 7.5" (1.715 m) and weight is 118 lb 14.4 oz (53.933 kg). Her temperature is 97.5 F (36.4 C). Her blood pressure is 99/48 and her pulse is 71. [name]  No palpable supraclavicular or axillary adenopathy. The lungs are clear to auscultation. The heart has a regular rhythm and rate. Examination right breast reveals no mass or nipple discharge or bleeding. Examination of the left breast reveals  mild hyperpigmentation changes. Patient's lumpectomy scar is well-healed. There is no palpable or visible signs recurrence within the  breast,  no nipple discharge or bleeding.  Lab Findings: Lab Results  Component Value Date   WBC 7.3 11/24/2012   HGB 13.3 11/24/2012   PLT 231 11/24/2012      Radiographic Findings: No results found.  Impression:  No evidence for recurrence on clinical exam today  Plan:  When necessary followup in radiation oncology. Patient will continue followup with medical oncology and surgery.  ____________________________________ Blair Promise, MD

## 2013-05-14 NOTE — Telephone Encounter (Signed)
Pt states she still has not received the Jublia from the [facility] after two calls from her.  She would like to know what else can be done or if the rx needs to be called to the [facility] in [place].

## 2013-06-07 ENCOUNTER — Ambulatory Visit: Payer: Self-pay | Admitting: Hematology and Oncology

## 2013-06-14 LAB — CBC CANCER CENTER
BASOS PCT: 0.8 %
Basophil #: 0.1 x10 3/mm (ref 0.0–0.1)
EOS ABS: 0.1 x10 3/mm (ref 0.0–0.7)
Eosinophil %: 0.8 %
HCT: 42.4 % (ref 35.0–47.0)
HGB: 14.1 g/dL (ref 12.0–16.0)
Lymphocyte #: 1.4 x10 3/mm (ref 1.0–3.6)
Lymphocyte %: 22 %
MCH: 31 pg (ref 26.0–34.0)
MCHC: 33.3 g/dL (ref 32.0–36.0)
MCV: 93 fL (ref 80–100)
MONOS PCT: 7.7 %
Monocyte #: 0.5 x10 3/mm (ref 0.2–0.9)
NEUTROS ABS: 4.5 x10 3/mm (ref 1.4–6.5)
NEUTROS PCT: 68.7 %
PLATELETS: 204 x10 3/mm (ref 150–440)
RBC: 4.55 10*6/uL (ref 3.80–5.20)
RDW: 12.2 % (ref 11.5–14.5)
WBC: 6.6 x10 3/mm (ref 3.6–11.0)

## 2013-06-14 LAB — COMPREHENSIVE METABOLIC PANEL
ALK PHOS: 68 U/L
ALT: 31 U/L (ref 12–78)
AST: 24 U/L (ref 15–37)
Albumin: 4.2 g/dL (ref 3.4–5.0)
Anion Gap: 11 (ref 7–16)
BUN: 15 mg/dL (ref 7–18)
Bilirubin,Total: 0.5 mg/dL (ref 0.2–1.0)
CO2: 27 mmol/L (ref 21–32)
Calcium, Total: 8.9 mg/dL (ref 8.5–10.1)
Chloride: 101 mmol/L (ref 98–107)
Creatinine: 1.04 mg/dL (ref 0.60–1.30)
EGFR (African American): >60
EGFR (Non-African Amer.): >60
Glucose: 87 mg/dL (ref 65–99)
Osmolality: 278 (ref 275–301)
POTASSIUM: 3.9 mmol/L (ref 3.5–5.1)
Sodium: 139 mmol/L (ref 136–145)
Total Protein: 8.2 g/dL (ref 6.4–8.2)

## 2013-06-15 LAB — CANCER ANTIGEN 27.29: CA 27.29: 11.7 U/mL (ref 0.0–38.6)

## 2013-06-24 ENCOUNTER — Ambulatory Visit: Payer: Self-pay | Admitting: Hematology and Oncology

## 2013-06-25 ENCOUNTER — Ambulatory Visit: Payer: Self-pay | Admitting: General Surgery

## 2013-06-26 ENCOUNTER — Encounter: Payer: Self-pay | Admitting: General Surgery

## 2013-06-26 ENCOUNTER — Telehealth: Payer: Self-pay | Admitting: *Deleted

## 2013-06-26 NOTE — Telephone Encounter (Signed)
Notified patient as instructed, patient pleased. Discussed follow-up appointments, pt requested sooner appt, changed to 07-10-13 at 1:45, patient agrees. She asked if the changes/calfications were there on previous films or MRI or if they are new?

## 2013-06-26 NOTE — Telephone Encounter (Signed)
Message copied by Carson Myrtle on Tue Jun 26, 2013  1:30 PM ------      Message from: [place], Forest Gleason      Created: Tue Jun 26, 2013 12:05 PM       Let the patient know that I looked at her recent mammograms.  I am not concerned.  (Radiologist has raised a concern). Can move appt up from 3/25 if she desires.  ------

## 2013-07-02 ENCOUNTER — Encounter: Payer: Self-pay | Admitting: General Surgery

## 2013-07-02 ENCOUNTER — Ambulatory Visit (INDEPENDENT_AMBULATORY_CARE_PROVIDER_SITE_OTHER): Payer: BC Managed Care – PPO | Admitting: General Surgery

## 2013-07-02 VITALS — BP 102/68 | HR 72 | Resp 14 | Ht 67.0 in | Wt 120.0 lb

## 2013-07-02 DIAGNOSIS — C50919 Malignant neoplasm of unspecified site of unspecified female breast: Secondary | ICD-10-CM

## 2013-07-02 DIAGNOSIS — C50912 Malignant neoplasm of unspecified site of left female breast: Secondary | ICD-10-CM

## 2013-07-02 NOTE — Patient Instructions (Signed)
Patient will be asked to return to the office in six months for a bilateral diagnotic mammogram or have a left breast stereo biopsy.

## 2013-07-02 NOTE — Progress Notes (Signed)
Patient ID: Kim Woods, female   DOB: [birth year], 53 y.o.   MRN: [id number]  Chief Complaint  Patient presents with  . Follow-up    mammogram    HPI Kim Woods is a 53 y.o. female who presents for a breast evaluation. The most recent mammogram was done on 06/25/13.Patient does perform regular self breast checks and gets regular mammograms done.    HPI  Past Medical History  Diagnosis Date  . Stress fracture of foot   . Patient underweight   . Irritable bowel syndrome 2007    GI eval with colonoscopy [facility]  . Breast cancer     left breast invasive mammary carcinoma  . Asthma     precipitated by seasonal allergies   . Hx of radiation therapy 08/30/12- 10/04/12    left breast 50.4 gy 28 fx, upper outer aspect  boosted 63 gy    Past Surgical History  Procedure Laterality Date  . Endometrial ablation  Sept 2011    Novasure  . Sentinel lymph node biopsy Left 06/27/2012    incisional bx/lumpectomy  . Colonoscopy  2006    Dr. Vira Agar  . Breast surgery  2004    left, normal age 42, incisional bx  . Lymph node biopsy Left 07/12/2012    Family History  Problem Relation Age of Onset  . Cancer Mother     uterine  . Mental illness Father 48    parkinsons disease  . Cancer Paternal Grandmother     ostesarcoma hip,     Social History History  Substance Use Topics  . Smoking status: Never Smoker   . Smokeless tobacco: Never Used  . Alcohol Use: 4.2 oz/week    7 Glasses of wine per week    Allergies  Allergen Reactions  . Ciprofloxacin Other (See Comments)    Numbness and tingling  . Nitrofurantoin Monohyd Macro     Numbness, tingling in hands and feet    Current Outpatient Prescriptions  Medication Sig Dispense Refill  . calcium citrate-vitamin D 200-200 MG-UNIT TABS Take 2 tablets by mouth daily.      . Efinaconazole 10 % SOLN Apply 1 drop topically daily.  4 mL  2  . ESTRACE VAGINAL 0.1 MG/GM vaginal cream 1 Applicatorful as needed.      [name] letrozole (FEMARA)  2.5 MG tablet Take 2.5 mg by mouth daily.       . Zinc 30 MG TABS Take by mouth.       No current facility-administered medications for this visit.    Review of Systems Review of Systems  Constitutional: Negative.   Respiratory: Negative.   Cardiovascular: Negative.     Blood pressure 102/68, pulse 72, resp. rate 14, height 5\' 7"  (1.702 m), weight 120 lb (54.432 kg).  Physical Exam Physical Exam  Constitutional: She is oriented to person, place, and time. She appears well-developed and well-nourished.  Eyes: Conjunctivae are normal.  Neck: Neck supple.  Cardiovascular: Normal rate, regular rhythm and normal heart sounds.   Pulmonary/Chest: Effort normal and breath sounds normal. Right breast exhibits no inverted nipple, no mass, no nipple discharge, no skin change and no tenderness. Left breast exhibits no inverted nipple, no mass, no nipple discharge, no skin change and no tenderness.  Left breast well healed scar upper outer quadrant.  Lymphadenopathy:    She has no cervical adenopathy.    She has no axillary adenopathy.  Neurological: She is alert and oriented to person, place, and time.  Skin:  Skin is warm and dry.    Data Reviewed Bilateral diagnostic mammogram dated 06/25/2013 was reviewed. Postsurgical changes. Indeterminate microcalcifications in the upper outer quadrant of the left breast. BI-RAD-4.  There has been a significant change in the breast appearance since all breast radiation and the initiation of much resolved. I am not convinced that the microcalcifications are entirely new or warranted excision in light of her negative MRI in March, 2014, low-grade tumor and multiple negative sentinel node.  Assessment    Clinically doing well status post wide excision of a low-grade invasive mammary carcinoma. Questionable new mammographic findings.      Plan    Options for management were reviewed in detail: 1) proceed with stereotactic biopsy as recommended by  radiology. The patient's understanding was that 2 areas were of concern only one of which would require biopsy versus 2) 6 month followup exam.  Based on previous conversations with the radiology group it is unlikely that a repeat MRI would sort the week from the chaff in regards to a small foci of microcalcifications.  I think there is little risk observation, but if the patient is concerned it is not unreasonable to proceed to biopsy.  Her original wide excision was completed as an office procedure, and she is concerned that her small breast volume may make the stereotactic procedure difficult. If this was indeed the case, wire localization would be undertaken.  At this time she is to consider her options, but at the time of her visit was leaning towards biopsy. She was given a prescription for Valium 10 mg, one tablet used one hour prior to the procedure, with the assurance that she would have a driver the day of the chest should be completed on 07/09/2013.  She was asked to contact the office tomorrow to let us know her final decision regarding biopsy.       Robert Bellow 07/03/2013, 5:04 PM     On 07/03/2013 the patient contacted the office notifying us that she was going to defer biopsy at this time. She is aware that she can change her mind if she is so inclined. Follow up otherwise will be with a six-month diagnostic left breast mammogram.

## 2013-07-03 ENCOUNTER — Encounter: Payer: Self-pay | Admitting: General Surgery

## 2013-07-03 ENCOUNTER — Telehealth: Payer: Self-pay | Admitting: *Deleted

## 2013-07-03 DIAGNOSIS — C50912 Malignant neoplasm of unspecified site of left female breast: Secondary | ICD-10-CM | POA: Insufficient documentation

## 2013-07-03 NOTE — Telephone Encounter (Signed)
Patient called to report that she does not want to proceed with stereo biopsy next Monday. She wishes to hold off for right now. I told her we would be sending her a letter to follow up in 6 months unless she changes her mind before then. Patient verbalizes understanding.

## 2013-07-10 ENCOUNTER — Ambulatory Visit: Payer: Self-pay | Admitting: General Surgery

## 2013-07-11 ENCOUNTER — Encounter: Payer: Self-pay | Admitting: General Surgery

## 2013-07-18 ENCOUNTER — Ambulatory Visit: Payer: BC Managed Care – PPO | Admitting: General Surgery

## 2013-08-13 ENCOUNTER — Ambulatory Visit: Payer: Self-pay | Admitting: Hematology and Oncology

## 2013-08-16 ENCOUNTER — Ambulatory Visit (INDEPENDENT_AMBULATORY_CARE_PROVIDER_SITE_OTHER): Payer: BC Managed Care – PPO | Admitting: Podiatrist

## 2013-08-16 VITALS — Resp 16 | Ht 67.0 in | Wt 118.0 lb

## 2013-08-16 DIAGNOSIS — B351 Tinea unguium: Secondary | ICD-10-CM

## 2013-08-16 NOTE — Progress Notes (Signed)
Chief Complaint  Patient presents with  . Follow-up    i want to see if i still need to use the jublia and i may have some fungus on my 4th toenail left     HPI: Patient is 53 y.o. female who presents today for recheck of toenail fungus on the left great toenail medial border and to see if she may have fungus starting on the left 4th toenail.  She has used almost 2 bottles of the Jublia and states she thinks it has helped on the great toenail.  She split her left 4th toenail recently and states it appears a bit yellowish.      Physical Exam Neurovascular status is intact with palpable pedal pulses at 2/4 DP and PT bilateral and capillary refill time within normal limits. Neurological sensation is also intact bilaterally both epicritically and protectively. Musculoskeletal examination reveals good muscle, strength, tone and stability. Musculature intact with dorsiflexion, plantarflexion, inversion, eversion. Left hallux nail appears to have grown out and a tiny amount of discoloration is still present at the most distal medial tip of the toenail.  The left 4th toenail looks normal-- it has a split on the lateral side but it does not look fungal.     Assessment: mycotic left hallux nail improving with Jublia  Plan: recommended finishing out her final bottle of Jublia.  As the nail grows out it appears to be normal.  She will keep an eye on it and if she feels she needs more Jublia she will call for a refill

## 2013-08-29 ENCOUNTER — Encounter: Payer: Self-pay | Admitting: General Surgery

## 2013-12-04 ENCOUNTER — Ambulatory Visit: Payer: Self-pay | Admitting: Hematology and Oncology

## 2013-12-04 LAB — COMPREHENSIVE METABOLIC PANEL
ALT: 23 U/L
Albumin: 3.9 g/dL (ref 3.4–5.0)
Alkaline Phosphatase: 69 U/L
Anion Gap: 6 — ABNORMAL LOW (ref 7–16)
BUN: 14 mg/dL (ref 7–18)
Bilirubin,Total: 0.5 mg/dL (ref 0.2–1.0)
CHLORIDE: 102 mmol/L (ref 98–107)
CO2: 29 mmol/L (ref 21–32)
Calcium, Total: 9.1 mg/dL (ref 8.5–10.1)
Creatinine: 1.16 mg/dL (ref 0.60–1.30)
EGFR (African American): >60
GFR CALC NON AF AMER: 54 — AB
Glucose: 91 mg/dL (ref 65–99)
OSMOLALITY: 274 (ref 275–301)
Potassium: 4.1 mmol/L (ref 3.5–5.1)
SGOT(AST): 17 U/L (ref 15–37)
Sodium: 137 mmol/L (ref 136–145)
Total Protein: 7.7 g/dL (ref 6.4–8.2)

## 2013-12-04 LAB — CBC CANCER CENTER
BASOS ABS: 0 x10 3/mm (ref 0.0–0.1)
Basophil %: 0.8 %
EOS ABS: 0.1 x10 3/mm (ref 0.0–0.7)
EOS PCT: 1.6 %
HCT: 40 % (ref 35.0–47.0)
HGB: 13.4 g/dL (ref 12.0–16.0)
LYMPHS ABS: 1.6 x10 3/mm (ref 1.0–3.6)
LYMPHS PCT: 29.9 %
MCH: 31.3 pg (ref 26.0–34.0)
MCHC: 33.4 g/dL (ref 32.0–36.0)
MCV: 94 fL (ref 80–100)
MONO ABS: 0.5 x10 3/mm (ref 0.2–0.9)
MONOS PCT: 9.2 %
NEUTROS PCT: 58.5 %
Neutrophil #: 3.1 x10 3/mm (ref 1.4–6.5)
PLATELETS: 194 x10 3/mm (ref 150–440)
RBC: 4.26 10*6/uL (ref 3.80–5.20)
RDW: 12.4 % (ref 11.5–14.5)
WBC: 5.4 x10 3/mm (ref 3.6–11.0)

## 2013-12-06 LAB — CANCER ANTIGEN 27.29: CA 27.29: 8.1 U/mL (ref 0.0–38.6)

## 2013-12-25 ENCOUNTER — Ambulatory Visit: Payer: Self-pay | Admitting: Hematology and Oncology

## 2014-01-07 ENCOUNTER — Encounter: Payer: Self-pay | Admitting: General Surgery

## 2014-01-16 ENCOUNTER — Encounter: Payer: Self-pay | Admitting: General Surgery

## 2014-01-16 ENCOUNTER — Ambulatory Visit (INDEPENDENT_AMBULATORY_CARE_PROVIDER_SITE_OTHER): Payer: BC Managed Care – PPO | Admitting: General Surgery

## 2014-01-16 VITALS — BP 118/70 | HR 78 | Resp 12 | Ht 67.0 in | Wt 117.0 lb

## 2014-01-16 DIAGNOSIS — C50919 Malignant neoplasm of unspecified site of unspecified female breast: Secondary | ICD-10-CM

## 2014-01-16 DIAGNOSIS — C50912 Malignant neoplasm of unspecified site of left female breast: Secondary | ICD-10-CM

## 2014-01-16 NOTE — Progress Notes (Addendum)
Patient ID: Kim Woods, female   DOB: 1961/03/15, 53 y.o.   MRN: [id number]  Chief Complaint  Patient presents with  . Follow-up    mammogram    HPI Kim Woods is a 53 y.o. female who presents for a breast evaluation. The most recent left breast  mammogram was done on 01/04/14 at [facility].  Patient does perform regular self breast checks and gets regular mammograms done.    HPI  Past Medical History  Diagnosis Date  . Stress fracture of foot   . Patient underweight   . Irritable bowel syndrome 2007    GI eval with colonoscopy [facility]  . Asthma     precipitated by seasonal allergies   . Hx of radiation therapy 08/30/12- 10/04/12    left breast 50.4 gy 28 fx, upper outer aspect  boosted 63 gy  . Breast cancer     7 mm tubular carcinoma, grade 1, ER 90%, PR 90%, HER-2/neu not over expressed. BRCA 1-2: No mutation treated with wide local excision, sentinel node biopsy and whole breast radiation.    Past Surgical History  Procedure Laterality Date  . Endometrial ablation  Sept 2011    Novasure  . Sentinel lymph node biopsy Left 06/27/2012    incisional bx/lumpectomy  . Colonoscopy  2006    Dr. Vira Agar  . Breast surgery  2004    left, normal age 7, incisional bx  . Lymph node biopsy Left 07/12/2012    Family History  Problem Relation Age of Onset  . Cancer Mother     uterine  . Mental illness Father 54    parkinsons disease  . Cancer Paternal Grandmother     ostesarcoma hip,     Social History History  Substance Use Topics  . Smoking status: Never Smoker   . Smokeless tobacco: Never Used  . Alcohol Use: 4.2 oz/week    7 Glasses of wine per week    Allergies  Allergen Reactions  . Ciprofloxacin Other (See Comments)    Numbness and tingling  . Nitrofurantoin Monohyd Macro     Numbness, tingling in hands and feet    Current Outpatient Prescriptions  Medication Sig Dispense Refill  . calcium citrate-vitamin D 200-200 MG-UNIT TABS Take 2 tablets by mouth daily.       . Efinaconazole 10 % SOLN Apply 1 drop topically daily.  4 mL  2  . ESTRACE VAGINAL 0.1 MG/GM vaginal cream 1 Applicatorful as needed.      [name] letrozole (FEMARA) 2.5 MG tablet Take 2.5 mg by mouth daily.       . Zinc 30 MG TABS Take by mouth.       No current facility-administered medications for this visit.    Review of Systems Review of Systems  Constitutional: Negative.   Respiratory: Negative.   Cardiovascular: Negative.     Blood pressure 118/70, pulse 78, resp. rate 12, height _0  (1.702 m), weight 117 lb (53.071 kg).  Physical Exam Physical Exam  Constitutional: She is oriented to person, place, and time. She appears well-developed and well-nourished.  Eyes: Conjunctivae are normal. No scleral icterus.  Neck: Neck supple.  Cardiovascular: Normal rate, regular rhythm and normal heart sounds.   Pulmonary/Chest: Effort normal and breath sounds normal. Right breast exhibits no inverted nipple, no mass, no nipple discharge, no skin change and no tenderness. Left breast exhibits no inverted nipple, no mass, no nipple discharge, no skin change and no tenderness.  Left  Breast incision looks  clean and well healed.   Lymphadenopathy:    She has no cervical adenopathy.    She has no axillary adenopathy.  Neurological: She is alert and oriented to person, place, and time.  Skin: Skin is warm and dry.    Data Reviewed Left breast mammogram completed at [facility] dated 01/04/2014 was reviewed. Postsurgical scarring. Calcifications suggestive of sclerosing adenosis. BI-RAD-3.  Assessment    Doing well, with good tolerance of antiestrogen therapy.    Plan    Continue use of low-dose Estrace for vaginal dryness and dyspareunia is appropriate.  We'll arrange for bilateral diagnostic mammograms in 6 months.  The patient had been followed by Dr. Kallie Edward at the [place], but she is left the area. The patient may reestablish resolved with the new oncologist or continue to  have her annual exams and letrozole therapy managed to this office. She was notified that her most recent CA 27-29 was lower than prior values. We did have a brief discussion about the pros and cons of ongoing laboratory screening.    PCP: Mattie Marlin 01/17/2014, 10:59 AM

## 2014-01-17 ENCOUNTER — Encounter: Payer: Self-pay | Admitting: General Surgery

## 2014-01-29 ENCOUNTER — Ambulatory Visit: Payer: Self-pay | Admitting: Family Medicine

## 2014-02-09 ENCOUNTER — Ambulatory Visit (INDEPENDENT_AMBULATORY_CARE_PROVIDER_SITE_OTHER): Payer: BC Managed Care – PPO

## 2014-02-09 DIAGNOSIS — Z23 Encounter for immunization: Secondary | ICD-10-CM

## 2014-02-25 ENCOUNTER — Encounter: Payer: Self-pay | Admitting: General Surgery

## 2014-03-06 ENCOUNTER — Telehealth: Payer: Self-pay | Admitting: Internal Medicine

## 2014-03-07 ENCOUNTER — Encounter: Payer: Self-pay | Admitting: Internal Medicine

## 2014-03-11 ENCOUNTER — Telehealth: Payer: Self-pay | Admitting: Internal Medicine

## 2014-03-15 ENCOUNTER — Encounter: Payer: Self-pay | Admitting: Internal Medicine

## 2014-03-15 NOTE — Telephone Encounter (Signed)
Not in patient's chart, see other telephone note. Appt was scheduled.

## 2014-06-06 ENCOUNTER — Encounter: Payer: Self-pay | Admitting: Internal Medicine

## 2014-06-22 ENCOUNTER — Encounter: Payer: Self-pay | Admitting: Internal Medicine

## 2014-06-22 DIAGNOSIS — R000 Tachycardia, unspecified: Principal | ICD-10-CM

## 2014-06-22 DIAGNOSIS — I951 Orthostatic hypotension: Principal | ICD-10-CM

## 2014-06-22 DIAGNOSIS — G90A Postural orthostatic tachycardia syndrome (POTS): Secondary | ICD-10-CM

## 2014-06-25 ENCOUNTER — Encounter: Payer: Self-pay | Admitting: General Surgery

## 2014-07-02 ENCOUNTER — Ambulatory Visit (INDEPENDENT_AMBULATORY_CARE_PROVIDER_SITE_OTHER): Payer: BLUE CROSS/BLUE SHIELD | Admitting: General Surgery

## 2014-07-02 ENCOUNTER — Encounter: Payer: Self-pay | Admitting: General Surgery

## 2014-07-02 VITALS — BP 100/70 | HR 60 | Resp 12 | Ht 67.0 in | Wt 117.0 lb

## 2014-07-02 DIAGNOSIS — C50912 Malignant neoplasm of unspecified site of left female breast: Secondary | ICD-10-CM | POA: Diagnosis not present

## 2014-07-02 NOTE — Patient Instructions (Signed)
Patient to return in 1 year with bilateral diagnostic mammogram. Continue self breast exams. Call office for any new breast issues or concerns.  

## 2014-07-02 NOTE — Progress Notes (Signed)
Patient ID: Kim Woods, female   DOB: 10/30/1960, 54 y.o.   MRN: [id number]  Chief Complaint  Patient presents with  . Follow-up    mammogram     HPI Kim Woods is a 54 y.o. female who presents for a breast evaluation. The most recent mammogram was done on 06/25/14. Patient does perform regular self breast checks and gets regular mammograms done. The patient denies any new issues with the breasts at this time.   The patient has experienced some tinnitus, and reports being evaluated by Malon Kindle, M.D. with mild hearing loss. The patient's review of the Internet information suggested that this might be related to her Femara therapy.   HPI  Past Medical History  Diagnosis Date  . Stress fracture of foot   . Patient underweight   . Irritable bowel syndrome 2007    GI eval with colonoscopy [place] [facility]  . Asthma     precipitated by seasonal allergies   . Hx of radiation therapy 08/30/12- 10/04/12    left breast 50.4 gy 28 fx, upper outer aspect  boosted 63 gy  . Breast cancer     7 mm tubular carcinoma, grade 1, ER 90%, PR 90%, HER-2/neu not over expressed. BRCA 1-2: No mutation. Treated with wide local excision, sentinel node biopsy and whole breast radiation.    Past Surgical History  Procedure Laterality Date  . Endometrial ablation  Sept 2011    Novasure  . Sentinel lymph node biopsy Left 06/27/2012    incisional bx/lumpectomy  . Colonoscopy  2006    Dr. Vira Agar  . Breast surgery  2004    left, normal age 77, incisional bx  . Lymph node biopsy Left 07/12/2012    Family History  Problem Relation Age of Onset  . Cancer Mother     uterine  . Mental illness Father 29    parkinsons disease  . Cancer Paternal Grandmother     ostesarcoma hip,     Social History History  Substance Use Topics  . Smoking status: Never Smoker   . Smokeless tobacco: Never Used  . Alcohol Use: 4.2 oz/week    7 Glasses of wine per week    Allergies  Allergen Reactions  . Ciprofloxacin  Other (See Comments)    Numbness and tingling  . Nitrofurantoin Monohyd Macro     Numbness, tingling in hands and feet  . Cefuroxime Axetil Rash    Current Outpatient Prescriptions  Medication Sig Dispense Refill  . calcium citrate-vitamin D 200-200 MG-UNIT TABS Take 2 tablets by mouth daily.    [name] ESTRACE VAGINAL 0.1 MG/GM vaginal cream 1 Applicatorful as needed.    [name] letrozole (FEMARA) 2.5 MG tablet Take 2.5 mg by mouth daily.      No current facility-administered medications for this visit.    Review of Systems Review of Systems  Constitutional: Negative.   Respiratory: Negative.   Cardiovascular: Negative.     Blood pressure 100/70, pulse 60, resp. rate 12, height 5' 7" (1.702 m), weight 117 lb (53.071 kg).  Physical Exam Physical Exam  Constitutional: She is oriented to person, place, and time. She appears well-developed and well-nourished.  Neck: Neck supple. No thyromegaly present.  Cardiovascular: Normal rate, regular rhythm and normal heart sounds.   No murmur heard. Pulmonary/Chest: Effort normal and breath sounds normal. Right breast exhibits no inverted nipple, no mass, no nipple discharge, no skin change and no tenderness. Left breast exhibits no inverted nipple, no mass, no nipple discharge, no  skin change and no tenderness.    Little dimpling in the left outer quadrant at lumpectomy site.   Lymphadenopathy:    She has no cervical adenopathy.    She has no axillary adenopathy.  Neurological: She is alert and oriented to person, place, and time.  Skin: Skin is warm and dry.    Data Reviewed Bilateral mammograms dated 06/25/2014 completed at [facility] were reviewed. No interval change. BI-RADS-2.  August 2015 CA 27-29: 8.1, down from 10.2 prior.  Assessment    Doing well now 2 years status post breast conservation, whole breast radiation and ongoing letrozole therapy for stage I carcinoma the left breast.    Plan    Options for renewed medical  oncology assessment versus ongoing follow-up ear were discussed. Pros and cons of routine blood work were reviewed. Pros and cons of MRI screening reviewed.  The patient has a very low risk tumor and I think the likelihood that ongoing testing (CA 27-29) sees will be of benefit is small.   In regards to tinnitus, options were as follows: 1) sees discontinue letrozole for 3 months to see if tinnitus resolves versus 2) swap tamoxifen for resolved versus 3) no change in therapy.  The patient is likely to touch base again with Dr. Richardson Landry to see if any further testing would be appropriate.  At this time we'll plan for a follow-up examination in one year with bilateral diagnostic mammograms at that time.    PCP:  Mattie Marlin 07/02/2014, 6:06 PM

## 2014-08-05 ENCOUNTER — Encounter: Payer: Self-pay | Admitting: Internal Medicine

## 2014-08-06 ENCOUNTER — Encounter: Payer: Self-pay | Admitting: *Deleted

## 2014-08-06 NOTE — Telephone Encounter (Signed)
Please advise can I fill EPI-PEN.

## 2014-08-16 NOTE — Consult Note (Signed)
Reason for Visit: This 54 year old Female patient presents to the clinic for initial evaluation of  breast cancer .   Referred by Dr. Hervey Ard.  Diagnosis:  Chief Complaint/Diagnosis   54 year old female status post wide local excision and sentinel node biopsy for pathologic stage Ia (T1 B. 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 Mammograman ultrasound confirmed mass although do not have those reports for my records. She underwent an excisional biopsy by Dr. Tollie Pizza showing8.7 CM tubular carcinoma with clear margins. Tumor was ER/PR positive HER-2/neu negative. Tumor had an overall grade of one. There was ductal carcinoma in situ presentalthough margins were clear at 1 mm. She wants have a sentinel node biopsy showing no evidence of metastatic disease in her sentinel nodes. she had an MRI of her breast after completion of wide local excision selling no evidence to suggest residual disease.She has done well postoperatively. She seen medical oncology who will place her on aromatase inhibitors after completion of radiation. She seen today for radiation opinion. She is doing well. She specifically denies breast tenderness cough or bone pain.  Past Hx:    Breast Cancer:    Irregular Heart Beat: h/o tachycardia 2004   Asthma:    Anemia:    Laparoscopy/Uterine Ablation: 2011   Left Breast Biopsy:   Past, Family and Social History:  Past Medical History positive   Cardiovascular irregular heartbeat   Respiratory asthma   Past Surgical History laparoscopic uterine ablation   Past Medical  History Comments history of anemia   Family History positive   Family History Comments great aunt with breast cancer mother with uterine cancer grandmother also with sarcoma   Social History noncontributory   Additional Past Medical and Surgical History accompanied by husband today   Allergies:   Ceftin: Rash  Contrast - Iodinated Radiocontrast Dye: Other  Cipro: Other  Zithromax Z-Pak: Unknown  Macrobid: Unknown  Home Meds:  Home Medications: Medication Instructions Status  albuterol CFC free 90 mcg/inh inhalation aerosol 2 puff(s) inhaled 4 times a day, As Needed Active  Calcium 600+D 600 mg-200 units oral tablet 1 tab(s) orally 2 times a day Active   Review of Systems:  General negative   Performance Status (ECOG) 0   Skin negative   Breast see HPI   Ophthalmologic negative   ENMT negative   Respiratory and Thorax negative   Cardiovascular negative   Gastrointestinal negative   Genitourinary negative   Musculoskeletal negative   Neurological negative   Psychiatric negative   Hematology/Lymphatics negative   Endocrine negative   Allergic/Immunologic negative   Review of Systems   according to nurse's notesPatient denies any weight loss, fatigue, weakness, fever, chills or night sweats. Patient denies any loss of vision, blurred vision. Patient denies any ringing  of the ears or hearing loss. No irregular heartbeat. Patient denies heart murmur or history of fainting. Patient denies any chest pain or pain radiating to her upper extremities. Patient denies any shortness of breath, difficulty breathing at night, cough or hemoptysis. Patient denies any swelling in the lower legs. Patient denies any nausea vomiting, vomiting of blood, or coffee ground material  in the vomitus. Patient denies any stomach pain. Patient states has had normal bowel movements no significant constipation or diarrhea. Patient denies any dysuria, hematuria or significant nocturia. Patient  denies any problems walking, swelling in the joints or loss of balance. Patient denies any skin changes, loss of hair or loss of weight. Patient denies any excessive worrying or anxiety or significant depression. Patient denies any problems with insomnia. Patient denies excessive thirst, polyuria, polydipsia. Patient denies any swollen glands, patient denies easy bruising or easy bleeding. Patient denies any recent infections, allergies or URI. Patient "s visual fields have not changed significantly in recent time.  Nursing Notes:  Nursing Vital Signs and Chemo Nursing Nursing Notes: *CC Vital Signs Flowsheet:   03-Apr-14 08:50  Temp Temperature 97.8  Pulse Pulse 82  Respirations Respirations 16  SBP SBP 97  DBP DBP 64  Pain Scale (0-10)  1  Pulse Oxi  100  Current Weight (kg) (kg) 52.7  Height (cm) centimeters 172.8  BSA (m2) 1.6   Physical Exam:  General/Skin/HEENT:  General normal   Skin normal   Eyes normal   ENMT normal   Head and Neck normal   Additional PE well-developed thin female in NAD. Breasts are small. She status post wide local excision left breast. No dominant mass or nodularity is noted in either breast into position examined. No axillary or supraclavicular adenopathy is appreciated. Lungs are clear to A&P cardiac examination shows regular rate and rhythm.   Breasts/Resp/CV/GI/GU:  Respiratory and Thorax normal   Cardiovascular normal   Gastrointestinal normal   Genitourinary normal   MS/Neuro/Psych/Lymph:  Musculoskeletal normal   Neurological normal   Lymphatics normal   Assessment and Plan: Impression:   stage IA invasive mammary carcinoma left breast teslas wide local excision and sentinel node biopsy ER/PR positive and 54 year old female Plan:   at this time I have gone over treatment recommendations with the patient including mastectomy versus whole breast radiation. I assured her this survival advantage actually shows a slight advantage  for breast conservation in recent studies published. I believe her breast is too small to support a MammoSite catheter placementso I believe whole breast radiation therapy up to 5000 cGy with a 1600 cGy boost would be her best avenue of adjuvant treatment for her breast cancer. Patient will also need aromatase inhibitor treatment after completion of radiation therapy. Risks and benefits of treatment including irritation the skin, some inclusion superficial lung, fatigue, and possible alteration blood counts were all explained in detail to the patient and her husband. They both seem to comprehend my treatment plan well. I have set her up for CT simulation next week.  I would like to take this opportunity to thank you for allowing me to continue to participate in this patient's care.  CC Referral:  cc: Dr. Hervey Ard, Dr. Derrel Nip   Electronic Signatures: Baruch Gouty, Roda Shutters (MD)  (Signed 03-Apr-14 11:43)  Authored: HPI, Diagnosis, Past Hx, PFSH, Allergies, Home Meds, ROS, Nursing Notes, Physical Exam, Encounter Assessment and Plan, CC Referring Physician   Last Updated: 03-Apr-14 11:43 by Armstead Peaks (MD)

## 2014-08-16 NOTE — Op Note (Signed)
PATIENT NAME:  Kim Woods, Kim Woods MR#:  [id number] DATE OF BIRTH:  Dec 10, 1960  DATE OF PROCEDURE:  07/12/2012  PREOPERATIVE DIAGNOSIS: Left breast cancer.   POSTOPERATIVE DIAGNOSIS: Left breast cancer.   OPERATIVE PROCEDURE: Left axillary sentinel node biopsy.   SURGEON: Hervey Ard, MD   ANESTHESIA: General by LMA, Marcaine 0.5% with 1:200,000 units of epinephrine 10 mL local infiltration.   ESTIMATED BLOOD LOSS: Minimal.   CLINICAL NOTE: This 54 year old woman recently noted a mass in the breast, and biopsy showed evidence of a 7 mm invasive mammary carcinoma. Margins were clear, and she has elected to proceed to breast conservation. Sentinel node biopsy was recommended.   PROCEDURE IN DETAIL: The patient was injected with technetium with sulfur colloid prior to the procedure. After the induction of anesthesia, 2 mL of methylene blue diluted 1:2 with normal saline was placed in the subareolar plexus. The breast and axilla was then prepped with ChloraPrep and draped. The Gamma Finder was used to identify the area of increased uptake. Three nodes were eventually identified that were all hot and blue in color. These were sent for frozen section, and touch prep analysis reported by Bryan Lemma, MD was negative for macro metastatic disease.   Hemostasis was with electrocautery and 3-0 Vicryl ties. The wound was closed in layers with 2-0 Vicryl figure-of-eight sutures to the deep adipose tissue in the axillary envelope and a running  4-0 Vicryl subcuticular suture for the skin. Marcaine was infiltrated at the beginning of the procedure for postoperative analgesia. Benzoin and Steri-Strips, followed by a Telfa and Tegaderm dressing was applied.   The patient tolerated the procedure well and was taken to the recovery room in stable condition.     ____________________________ Robert Bellow, MD jwb:cb D: 07/12/2012 14:59:00 ET T: 07/12/2012 15:20:56 ET JOB#: [id number]  cc: Deborra Medina,  MD Robert Bellow, MD, <Dictator>  was taken  Modest Draeger Amedeo Kinsman MD ELECTRONICALLY SIGNED 07/13/2012 9:28

## 2014-09-26 ENCOUNTER — Telehealth: Payer: Self-pay | Admitting: *Deleted

## 2014-09-26 NOTE — Telephone Encounter (Signed)
Patient called and was last seen back in March 2016 for an office visit. She was told that you could prescribe her Estrace Vaginal Cream since Dr.Raminah left at the [facility] and she is following you for her breast. If you can prescribed her the cream she wants it mailed to her house through [facility]. She has a form that can be filled out and sent to [facility] that she can bring by if she has to. Just let me know and I will call the patient to let her know what she needs to do.

## 2014-09-27 ENCOUNTER — Other Ambulatory Visit: Payer: Self-pay | Admitting: General Surgery

## 2014-10-01 ENCOUNTER — Telehealth: Payer: Self-pay | Admitting: General Surgery

## 2014-10-01 NOTE — Telephone Encounter (Signed)
I spoke with the patient last night regarding the continued use of Estrace cream in light of her ongoing treatment with Femara. She reports that this had been discussed with Dr. Caryl Pina in the past, and there was equivocal data whether the Estrace would offset the Femara.  I've asked her to consider a trial of estradiol, E3 vaginal tablets are this has a much lower systemic absorption and is much less likely to interfere with the use of Femara.  I spoke with Clair Gulling at [place] [facility] and he will compound these tablets for the patient. She'll use 1 daily for a week and then 1 tablet vaginally twice a week thereafter. We'll follow up with the patient by phone to see if this resolves the severe vaginal dryness and dyspareunia she is experiencing even with twice a week Estrace therapy at this time.

## 2014-10-21 ENCOUNTER — Encounter: Payer: Self-pay | Admitting: Internal Medicine

## 2014-10-31 ENCOUNTER — Encounter: Payer: Self-pay | Admitting: Internal Medicine

## 2014-11-04 ENCOUNTER — Encounter: Payer: Self-pay | Admitting: Internal Medicine

## 2014-11-13 ENCOUNTER — Telehealth: Payer: Self-pay | Admitting: *Deleted

## 2014-11-13 NOTE — Telephone Encounter (Signed)
Patient wanted to talk to you about the medication Estradoil, she wanted to compare Estradoil to Estrace Cream and she wanted your opinion about the two.

## 2014-11-14 NOTE — Telephone Encounter (Signed)
The patient has not experienced is much relief of her vaginal dryness which is a daily problem with the use of the estradiol tablets as she did with Estrace cream. We reviewed the pros and cons of the 2 medications and my concern that Estrace is absorbed systemically far more than the estradiol.  We'll have her go back on a daily estradiol tablet for a week and then go to a twice a day schedule as opposed to 2 times per week. She may use vaginal lubricants lash hydrating agents as needed for comfort. The patient reports being symptomatic both during intercourse and with daily activities.  She'll give a call in 1 month with a progress report.

## 2014-12-17 ENCOUNTER — Other Ambulatory Visit: Payer: Self-pay | Admitting: Family Medicine

## 2015-02-12 ENCOUNTER — Ambulatory Visit (INDEPENDENT_AMBULATORY_CARE_PROVIDER_SITE_OTHER): Payer: BLUE CROSS/BLUE SHIELD

## 2015-02-12 DIAGNOSIS — Z23 Encounter for immunization: Secondary | ICD-10-CM

## 2015-02-13 NOTE — Progress Notes (Signed)
  I have reviewed the above information and agree with above.   Cayetano Mikita, MD 

## 2015-04-04 ENCOUNTER — Encounter: Payer: Self-pay | Admitting: *Deleted

## 2015-04-07 ENCOUNTER — Ambulatory Visit: Payer: BLUE CROSS/BLUE SHIELD | Admitting: Anesthesiology

## 2015-04-07 ENCOUNTER — Encounter
Admission: RE | Disposition: A | Discharge status: Home / Self Care | Payer: Self-pay | Source: Ambulatory Visit | Attending: Unknown Physician Specialty

## 2015-04-07 ENCOUNTER — Ambulatory Visit
Admission: RE | Admit: 2015-04-07 | Discharge: 2015-04-07 | Disposition: A | Discharge status: Home / Self Care | Payer: BLUE CROSS/BLUE SHIELD | Source: Ambulatory Visit | Attending: Unknown Physician Specialty | Admitting: Unknown Physician Specialty

## 2015-04-07 DIAGNOSIS — K640 First degree hemorrhoids: Secondary | ICD-10-CM | POA: Diagnosis not present

## 2015-04-07 DIAGNOSIS — D123 Benign neoplasm of transverse colon: Secondary | ICD-10-CM | POA: Insufficient documentation

## 2015-04-07 DIAGNOSIS — Z1211 Encounter for screening for malignant neoplasm of colon: Secondary | ICD-10-CM | POA: Diagnosis not present

## 2015-04-07 DIAGNOSIS — K635 Polyp of colon: Secondary | ICD-10-CM

## 2015-04-07 DIAGNOSIS — J45909 Unspecified asthma, uncomplicated: Secondary | ICD-10-CM | POA: Diagnosis not present

## 2015-04-07 DIAGNOSIS — Z853 Personal history of malignant neoplasm of breast: Secondary | ICD-10-CM | POA: Diagnosis not present

## 2015-04-07 HISTORY — PX: COLONOSCOPY: SHX5424

## 2015-04-07 HISTORY — DX: Polyp of colon: K63.5

## 2015-04-07 HISTORY — DX: Unspecified hemorrhoids: K64.9

## 2015-04-07 LAB — HM COLONOSCOPY

## 2015-04-07 SURGERY — COLONOSCOPY
Anesthesia: General

## 2015-04-07 MED ORDER — SODIUM CHLORIDE 0.9 % IV SOLN
INTRAVENOUS | Status: DC
  Administered 2015-04-07: 1000 mL via INTRAVENOUS

## 2015-04-07 MED ORDER — LIDOCAINE HCL (PF) 2 % IJ SOLN
INTRAMUSCULAR | Status: DC | PRN
  Administered 2015-04-07: 40 mg

## 2015-04-07 MED ORDER — PROPOFOL 500 MG/50ML IV EMUL
INTRAVENOUS | Status: DC | PRN
  Administered 2015-04-07: 100 ug/kg/min via INTRAVENOUS

## 2015-04-07 MED ORDER — EPHEDRINE SULFATE 50 MG/ML IJ SOLN
INTRAMUSCULAR | Status: DC | PRN
  Administered 2015-04-07: 10 mg via INTRAVENOUS
  Administered 2015-04-07: 5 mg via INTRAVENOUS

## 2015-04-07 MED ORDER — SODIUM CHLORIDE 0.9 % IV SOLN: INTRAVENOUS | Status: DC

## 2015-04-07 MED ORDER — PROPOFOL 10 MG/ML IV BOLUS
INTRAVENOUS | Status: DC | PRN
  Administered 2015-04-07 (×2): 20 mg via INTRAVENOUS

## 2015-04-07 MED ORDER — FENTANYL CITRATE (PF) 100 MCG/2ML IJ SOLN
INTRAMUSCULAR | Status: DC | PRN
  Administered 2015-04-07 (×2): 25 ug via INTRAVENOUS
  Administered 2015-04-07: 50 ug via INTRAVENOUS

## 2015-04-07 MED ORDER — MIDAZOLAM HCL 5 MG/5ML IJ SOLN
INTRAMUSCULAR | Status: DC | PRN
  Administered 2015-04-07 (×2): 1 mg via INTRAVENOUS

## 2015-04-07 NOTE — Anesthesia Postprocedure Evaluation (Signed)
Anesthesia Post Note  Patient: Kim Woods  Procedure(s) Performed: Procedure(s) (LRB): COLONOSCOPY (N/A)  Patient location during evaluation: PACU Anesthesia Type: General Level of consciousness: awake Pain management: satisfactory to patient Vital Signs Assessment: post-procedure vital signs reviewed and stable Respiratory status: respiratory function stable Cardiovascular status: stable Anesthetic complications: no    Last Vitals:  Filed Vitals:   04/07/15 1041  BP: 122/73  Pulse: 79  Temp: 36.4 C  Resp: 20    Last Pain: There were no vitals filed for this visit.               VAN STAVEREN,Syd Manges

## 2015-04-07 NOTE — Op Note (Signed)
[facility] Gastroenterology Patient Name: Kim Woods Procedure Date: 04/07/2015 11:24 AM MRN: [id number] Account #: [id number] Date of Birth: [birth year] Admit Type: Outpatient Age: 54 Room: [facility] ENDO ROOM 1 Gender: Female Note Status: Finalized Procedure:         Colonoscopy Indications:       Screening for colorectal malignant neoplasm Providers:         Manya Silvas, MD Referring MD:      Deborra Medina, MD (Referring MD) Medicines:         Propofol per Anesthesia Complications:     No immediate complications. Procedure:         Pre-Anesthesia Assessment:                    - After reviewing the risks and benefits, the patient was                     deemed in satisfactory condition to undergo the procedure.                    After obtaining informed consent, the colonoscope was                     passed under direct vision. Throughout the procedure, the                     patient's blood pressure, pulse, and oxygen saturations                     were monitored continuously. The Colonoscope was                     introduced through the anus and advanced to the the cecum,                     identified by appendiceal orifice and ileocecal valve. The                     colonoscopy was performed without difficulty. The patient                     tolerated the procedure well. The quality of the bowel                     preparation was good. Findings:      A diminutive polyp was found at the splenic flexure. The polyp was       sessile. The polyp was removed with a cold snare. Resection and       retrieval were complete.      Internal hemorrhoids were found during endoscopy. The hemorrhoids were       small and Grade I (internal hemorrhoids that do not prolapse).      The exam was otherwise without abnormality. Impression:        - One diminutive polyp at the splenic flexure. Resected                     and retrieved.                    - Internal  hemorrhoids.                    - The examination was otherwise normal. Recommendation:    - Await pathology results. Manya Silvas,  MD 04/07/2015 11:54:00 AM This report has been signed electronically. Number of Addenda: 0 Note Initiated On: 04/07/2015 11:24 AM Scope Withdrawal Time: 0 hours 10 minutes 31 seconds  Total Procedure Duration: 0 hours 23 minutes 17 seconds       [facility]

## 2015-04-07 NOTE — H&P (Signed)
Primary Care Physician:  Crecencio Mc, MD Primary Gastroenterologist:  Dr. Vira Agar  Pre-Procedure History & Physical: HPI:  Kim Woods is a 54 y.o. female is here for an colonoscopy.   Past Medical History  Diagnosis Date  . Stress fracture of foot   . Patient underweight   . Irritable bowel syndrome 2007    GI eval with colonoscopy [facility]  . Asthma     precipitated by seasonal allergies   . Hx of radiation therapy 08/30/12- 10/04/12    left breast 50.4 gy 28 fx, upper outer aspect  boosted 63 gy  . 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 BRCA 1-2: No mutation. Treated with wide local excision, sentinel node biopsy and whole breast radiation.  . Hemorrhoids     Past Surgical History  Procedure Laterality Date  . Endometrial ablation  Sept 2011    Novasure  . Sentinel lymph node biopsy Left 06/27/2012    incisional bx/lumpectomy  . Colonoscopy  2006    Dr. Vira Agar  . Breast surgery  2004    left, normal age 47, incisional bx  . Lymph node biopsy Left 07/12/2012  . Exploratory laparotomy  Sept 2011    Prior to Admission medications   Medication Sig Start Date End Date Taking? Authorizing Provider  calcium citrate-vitamin D 200-200 MG-UNIT TABS Take 2 tablets by mouth daily.   Yes Historical Provider, MD  ESTRACE VAGINAL 0.1 MG/GM vaginal cream 1 Applicatorful as needed. 01/02/13  Yes Historical Provider, MD  letrozole [facility]) 2.5 MG tablet TAKE 1 TABLET DAILY 12/17/14  Yes Evlyn Kanner, NP    Allergies as of 01/30/2015 - Review Complete 07/02/2014  Allergen Reaction Noted  . Ciprofloxacin Other (See Comments) 06/22/2011  . Nitrofurantoin monohyd macro  06/22/2011  . Cefuroxime axetil Rash 07/02/2014    Family History  Problem Relation Age of Onset  . Cancer Mother     uterine  . Mental illness Father 33    parkinsons disease  . Cancer Paternal Grandmother     ostesarcoma hip,      Social History   Social History  . Marital Status: Married    Spouse Name: N/A  . Number of Children: N/A  . Years of Education: N/A   Occupational History  . Not on file.   Social History Main Topics  . Smoking status: Never Smoker   . Smokeless tobacco: Never Used  . Alcohol Use: 4.2 oz/week    7 Glasses of wine per week  . Drug Use: No  . Sexual Activity: Yes     Comment: menarche age 64, P27, HRT x 3 mos -patch, cream   Other Topics Concern  . Not on file   Social History Narrative    Review of Systems: See HPI, otherwise negative ROS  Physical Exam: BP 122/73 mmHg  Pulse 79  Temp(Src) 97.5 F (36.4 C) (Tympanic)  Resp 20  Ht _0  (1.702 m)  Wt 53.524 kg (118 lb)  BMI 18.48 kg/m2  SpO2 100%  LMP 12/25/2009 General:   Alert,  pleasant and cooperative in NAD Head:  Normocephalic and atraumatic. Neck:  Supple; no masses or thyromegaly. Lungs:  Clear throughout to auscultation.    Heart:  Regular rate and rhythm. Abdomen:  Soft, nontender and nondistended. Normal bowel sounds, without guarding, and without rebound.   Neurologic:  Alert and  oriented x4;  grossly  normal neurologically.  Impression/Plan: Kim Woods is here for an colonoscopy to be performed for screening  Risks, benefits, limitations, and alternatives regarding  colonoscopy have been reviewed with the patient.  Questions have been answered.  All parties agreeable.   Gaylyn Cheers, MD  04/07/2015, 11:20 AM

## 2015-04-07 NOTE — Transfer of Care (Signed)
Immediate Anesthesia Transfer of Care Note  Patient: Kim Woods  Procedure(s) Performed: Procedure(s): COLONOSCOPY (N/A)  Patient Location: PACU  Anesthesia Type:General  Level of Consciousness: sedated  Airway & Oxygen Therapy: Patient Spontanous Breathing and Patient connected to nasal cannula oxygen  Post-op Assessment: Report given to RN and Post -op Vital signs reviewed and stable  Post vital signs: Reviewed and stable  Last Vitals:  Filed Vitals:   04/07/15 1041  BP: 122/73  Pulse: 79  Temp: 36.4 C  Resp: 20    Complications: No apparent anesthesia complications

## 2015-04-07 NOTE — Anesthesia Preprocedure Evaluation (Signed)
Anesthesia Evaluation  Patient identified by MRN, date of birth, ID band Patient awake    Reviewed: Allergy & Precautions, NPO status   Airway Mallampati: I       Dental  (+) Teeth Intact   Pulmonary neg pulmonary ROS,    Pulmonary exam normal        Cardiovascular negative cardio ROS   Rhythm:Regular     Neuro/Psych    GI/Hepatic negative GI ROS, Neg liver ROS,   Endo/Other  negative endocrine ROS  Renal/GU negative Renal ROS     Musculoskeletal negative musculoskeletal ROS (+)   Abdominal Normal abdominal exam  (+)   Peds  Hematology negative hematology ROS (+)   Anesthesia Other Findings   Reproductive/Obstetrics                             Anesthesia Physical Anesthesia Plan  ASA: I  Anesthesia Plan: General   Post-op Pain Management:    Induction: Intravenous  Airway Management Planned: Nasal Cannula  Additional Equipment:   Intra-op Plan:   Post-operative Plan:   Informed Consent: I have reviewed the patients History and Physical, chart, labs and discussed the procedure including the risks, benefits and alternatives for the proposed anesthesia with the patient or authorized representative who has indicated his/her understanding and acceptance.     Plan Discussed with: CRNA  Anesthesia Plan Comments:         Anesthesia Quick Evaluation

## 2015-04-08 LAB — SURGICAL PATHOLOGY

## 2015-04-09 ENCOUNTER — Encounter: Payer: Self-pay | Admitting: Unknown Physician Specialty

## 2015-05-05 ENCOUNTER — Encounter: Payer: Self-pay | Admitting: Internal Medicine

## 2015-05-07 ENCOUNTER — Encounter: Payer: Self-pay | Admitting: Internal Medicine

## 2015-05-25 ENCOUNTER — Encounter: Payer: Self-pay | Admitting: Internal Medicine

## 2015-05-26 NOTE — Telephone Encounter (Signed)
Please advise since requesting control may not except across state line?

## 2015-06-02 ENCOUNTER — Encounter: Payer: Self-pay | Admitting: Internal Medicine

## 2015-06-03 ENCOUNTER — Encounter: Payer: Self-pay | Admitting: Internal Medicine

## 2015-06-12 ENCOUNTER — Encounter: Payer: Self-pay | Admitting: Internal Medicine

## 2015-06-30 ENCOUNTER — Encounter: Payer: Self-pay | Admitting: General Surgery

## 2015-06-30 ENCOUNTER — Encounter: Payer: Self-pay | Admitting: Internal Medicine

## 2015-07-02 ENCOUNTER — Ambulatory Visit (INDEPENDENT_AMBULATORY_CARE_PROVIDER_SITE_OTHER): Payer: BLUE CROSS/BLUE SHIELD | Admitting: General Surgery

## 2015-07-02 ENCOUNTER — Encounter: Payer: Self-pay | Admitting: General Surgery

## 2015-07-02 VITALS — BP 108/68 | HR 70 | Resp 12 | Ht 67.5 in | Wt 115.0 lb

## 2015-07-02 DIAGNOSIS — C50912 Malignant neoplasm of unspecified site of left female breast: Secondary | ICD-10-CM

## 2015-07-02 MED ORDER — ESTRADIOL 0.1 MG/GM VA CREA: 1.0000 | TOPICAL_CREAM | VAGINAL | Status: DC

## 2015-07-02 NOTE — Patient Instructions (Signed)
The patient is aware to call back for any questions or concerns.  

## 2015-07-02 NOTE — Progress Notes (Signed)
Patient ID: Kim Woods, female   DOB: Jun 21, 1960, 55 y.o.   MRN: [id number]  Chief Complaint  Patient presents with  . Follow-up    mammogram    HPI Kim Woods is a 55 y.o. female.  female who presents for her follow up breast cancer and a breast evaluation. The most recent mammogram was done on 06/27/15. Patient does perform regular self breast checks and gets regular mammograms done. The patient denies any new issues with the breasts at this time, she had thought she felt something in the left breast but nothing specific. Tolerating letrozole.  The patient is making use of a compounded formula of estradiol with some relief of her vulvovaginal symptoms, but still significant discomfort and raised the possibility of low-dose vaginal estrogen to improve these symptoms.  She had a colonoscopy in December 2016 with Dr. Vira Agar and was troubled that a tubular adenoma had been identified.  HPI  Past Medical History  Diagnosis Date  . Stress fracture of foot   . Patient underweight   . Irritable bowel syndrome 2007    GI eval with colonoscopy [facility]  . Asthma     precipitated by seasonal allergies   . Hx of radiation therapy 08/30/12- 10/04/12    left breast 50.4 gy 28 fx, upper outer aspect  boosted 63 gy  . 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 BRCA 1-2: No mutation. Treated with wide local excision, sentinel node biopsy and whole breast radiation.  . Hemorrhoids   . Colon polyp 04-07-15    TUBULAR ADENOMA    Past Surgical History  Procedure Laterality Date  . Endometrial ablation  Sept 2011    Novasure  . Sentinel lymph node biopsy Left 06/27/2012    incisional bx/lumpectomy  . Colonoscopy  2006    Dr. Vira Agar  . Breast surgery  2004    left, normal age 46, incisional bx  . Lymph node biopsy Left 07/12/2012  . Exploratory laparotomy  Sept 2011  . Colonoscopy N/A 04/07/2015    Procedure: COLONOSCOPY;  Surgeon:  Manya Silvas, MD;  Location: [facility] ENDOSCOPY;  Service: Endoscopy;  Laterality: N/A;    Family History  Problem Relation Age of Onset  . Cancer Mother     uterine  . Mental illness Father 4    parkinsons disease  . Cancer Paternal Grandmother     ostesarcoma hip,     Social History Social History  Substance Use Topics  . Smoking status: Never Smoker   . Smokeless tobacco: Never Used  . Alcohol Use: 4.2 oz/week    7 Glasses of wine per week    Allergies  Allergen Reactions  . Ciprofloxacin Other (See Comments)    Numbness and tingling  . Nitrofurantoin Monohyd Macro     Numbness, tingling in hands and feet  . Cefuroxime Axetil Rash    Current Outpatient Prescriptions  Medication Sig Dispense Refill  . calcium citrate-vitamin D 200-200 MG-UNIT TABS Take 2 tablets by mouth daily.    [name] estradiol (ESTRACE VAGINAL) 0.1 MG/GM vaginal cream Place 1 Applicatorful vaginally once a week. 42.5 g 0  . letrozole (FEMARA) 2.5 MG tablet TAKE 1 TABLET DAILY 90 tablet 0  . NON FORMULARY Place 1 tablet vaginally as needed. estradiol, E3 vaginal tablets     No current facility-administered medications for this visit.    Review of Systems Review of Systems  Constitutional: Negative.  Respiratory: Negative.   Cardiovascular: Negative.     Blood pressure 108/68, pulse 70, resp. rate 12, height 5' 7.5" (1.715 m), weight 115 lb (52.164 kg), last menstrual period 12/25/2009.  Physical Exam Physical Exam  Constitutional: She is oriented to person, place, and time. She appears well-developed and well-nourished.  HENT:  Mouth/Throat: Oropharynx is clear and moist.  Eyes: Conjunctivae are normal. No scleral icterus.  Neck: Neck supple.  Cardiovascular: Normal rate, regular rhythm and normal heart sounds.   Pulmonary/Chest: Effort normal and breath sounds normal. Right breast exhibits no inverted nipple, no mass, no nipple discharge, no skin change and no tenderness. Left breast  exhibits no inverted nipple, no mass, no nipple discharge, no skin change and no tenderness.    Mild dimpling at wide excision site UOQ left breast  Lymphadenopathy:    She has no cervical adenopathy.    She has no axillary adenopathy.  Neurological: She is alert and oriented to person, place, and time.  Skin: Skin is warm and dry.  Psychiatric: Her behavior is normal.    Data Reviewed December 2016 colonoscopy report and pathology report reviewed. A single tubular adenoma without evidence of atypia was identified.  Bilateral diagnostic mammograms dated 06/27/2015 completed at [facility] were reviewed. Postsurgical/radiation changes noted in the left breast. Focal area of microcalcifications was some non-branching pattern noted in the right, these will be followed. BI-RADS-2.  Assessment    Doing well now almost 3 years status post management of her early stage breast cancer.  Symptoms of passed gentle deficiency incompletely controlled with vaginal asked and tablets.    Plan          CA 27.29 per patient request. Future lab request was submitted as she may be having laboratory studies in the next few weeks with her PCP. Estrace cream no refills, phone follow up in one month. Patient is aware that there is improved absorbing of estrogen when the Premarin formula and this may have an adverse effect on her letrozole therapy. At this time her pelvic/perineal discomfort is enough for her to consider the small risk.  The patient was asked to give a phone follow-up in one month to see if this relieves her symptoms.  Her weight is down about 3 pounds from last year. She does not report trying to lose weight, and reports she would like to gain weight. She does not tolerate milk products. Other forms of nutritional supplement would be appropriate.  We'll have the nurse confirm with her that she has had a GYN exam in the last year to be sure that another pathology is not overlooked and  attributed falsely to estrogen deficiency.   Patient will be asked to return to the office in one year with a bilateral diagnostic mammogram.   PCP:  Deborra Medina  This information has been scribed by Karie Fetch RNBC.    Robert Bellow 07/02/2015, 8:12 PM

## 2015-07-03 ENCOUNTER — Telehealth: Payer: Self-pay | Admitting: *Deleted

## 2015-07-03 NOTE — Telephone Encounter (Signed)
-----   Message from Robert Bellow, MD sent at 07/02/2015  8:18 PM EST ----- The patient should have had a formal GYN exam or pelvic exam with Dr.Tullo within the last year prior to initiating Premarin therapy.

## 2015-07-03 NOTE — Telephone Encounter (Signed)
Notified patient as instructed, patient agrees.  

## 2015-07-07 ENCOUNTER — Ambulatory Visit: Payer: BLUE CROSS/BLUE SHIELD | Admitting: General Surgery

## 2015-07-08 ENCOUNTER — Ambulatory Visit: Payer: BLUE CROSS/BLUE SHIELD | Admitting: General Surgery

## 2015-07-16 ENCOUNTER — Ambulatory Visit (INDEPENDENT_AMBULATORY_CARE_PROVIDER_SITE_OTHER): Payer: BLUE CROSS/BLUE SHIELD | Admitting: Internal Medicine

## 2015-07-16 ENCOUNTER — Encounter: Payer: Self-pay | Admitting: Internal Medicine

## 2015-07-16 VITALS — BP 98/60 | HR 69 | Temp 97.7°F | Resp 12 | Ht 68.0 in | Wt 112.8 lb

## 2015-07-16 DIAGNOSIS — E559 Vitamin D deficiency, unspecified: Secondary | ICD-10-CM

## 2015-07-16 DIAGNOSIS — E785 Hyperlipidemia, unspecified: Secondary | ICD-10-CM

## 2015-07-16 DIAGNOSIS — R634 Abnormal weight loss: Secondary | ICD-10-CM | POA: Diagnosis not present

## 2015-07-16 DIAGNOSIS — F411 Generalized anxiety disorder: Secondary | ICD-10-CM

## 2015-07-16 MED ORDER — ALPRAZOLAM 0.25 MG PO TABS: 0.2500 mg | ORAL_TABLET | Freq: Every evening | ORAL | Status: DC | PRN

## 2015-07-16 MED ORDER — ESCITALOPRAM OXALATE 5 MG PO TABS: 5.0000 mg | ORAL_TABLET | Freq: Every day | ORAL | Status: DC

## 2015-07-16 NOTE — Progress Notes (Signed)
Subjective:  Patient ID: Kim Woods, female    DOB: 1960-10-29  Age: 55 y.o. MRN: [id number]  CC: The primary encounter diagnosis was GAD (generalized anxiety disorder). Diagnoses of Loss of weight, Vitamin D deficiency, and Hyperlipidemia were also pertinent to this visit.  HPI Kim Woods presents for  Signs and symptoms of mild  Generalized  anxiety disorder.  Screening for GAD and depression were done and GAD screen was positive.   Major life stressors over the past year : BRCA Job change from hospital merger Marital  discord improved  Daughter 's decompensation during first year away at college after breakup with boyfriend  Parents aging and heath issues History of post partum depression    Has been in counselling for a year both couples and singles Worries constantly about things beyond her control   Has been less consistent and diligent with getting regular  exercise and counselling       Outpatient Prescriptions Prior to Visit  Medication Sig Dispense Refill  . calcium citrate-vitamin D 200-200 MG-UNIT TABS Take 2 tablets by mouth daily.    [name] letrozole (FEMARA) 2.5 MG tablet TAKE 1 TABLET DAILY 90 tablet 0  . NON FORMULARY Place 1 tablet vaginally as needed. estradiol, E3 vaginal tablets    . estradiol (ESTRACE VAGINAL) 0.1 MG/GM vaginal cream Place 1 Applicatorful vaginally once a week. (Patient not taking: Reported on 07/16/2015) 42.5 g 0   No facility-administered medications prior to visit.    Review of Systems;  Patient denies headache, fevers, malaise, unintentional weight loss, skin rash, eye pain, sinus congestion and sinus pain, sore throat, dysphagia,  hemoptysis , cough, dyspnea, wheezing, chest pain, palpitations, orthopnea, edema, abdominal pain, nausea, melena, diarrhea, constipation, flank pain, dysuria, hematuria, urinary  Frequency, nocturia, numbness, tingling, seizures,  Focal weakness, Loss of consciousness,  Tremor, insomnia, depression, anxiety, and  suicidal ideation.      Objective:  BP 98/60 mmHg  Pulse 69  Temp(Src) 97.7 F (36.5 C) (Oral)  Resp 12  Ht 5' 8"  (1.727 m)  Wt 112 lb 12 oz (51.143 kg)  BMI 17.15 kg/m2  SpO2 100%  LMP 12/25/2009  BP Readings from Last 3 Encounters:  07/16/15 98/60  07/02/15 108/68  04/07/15 138/69    Wt Readings from Last 3 Encounters:  07/16/15 112 lb 12 oz (51.143 kg)  07/02/15 115 lb (52.164 kg)  04/07/15 118 lb (53.524 kg)    General appearance: alert, cooperative and appears stated age Ears: normal TM's and external ear canals both ears Throat: lips, mucosa, and tongue normal; teeth and gums normal Neck: no adenopathy, no carotid bruit, supple, symmetrical, trachea midline and thyroid not enlarged, symmetric, no tenderness/mass/nodules Back: symmetric, no curvature. ROM normal. No CVA tenderness. Lungs: clear to auscultation bilaterally Heart: regular rate and rhythm, S1, S2 normal, no murmur, click, rub or gallop Abdomen: soft, non-tender; bowel sounds normal; no masses,  no organomegaly Pulses: 2+ and symmetric Skin: Skin color, texture, turgor normal. No rashes or lesions Lymph nodes: Cervical, supraclavicular, and axillary nodes normal.  No results found for: HGBA1C  Lab Results  Component Value Date   CREATININE 1.16 12/04/2013   CREATININE 1.04 06/14/2013   CREATININE 1.04 03/01/2013    Lab Results  Component Value Date   WBC 5.4 12/04/2013   HGB 13.4 12/04/2013   HCT 40.0 12/04/2013   PLT 194 12/04/2013   GLUCOSE 91 12/04/2013   CHOL 141 06/25/2011   TRIG 31.0 06/25/2011   HDL 68.10 06/25/2011  LDLCALC 67 06/25/2011   ALT 23 12/04/2013   AST 17 12/04/2013   NA 137 12/04/2013   K 4.1 12/04/2013   CL 102 12/04/2013   CREATININE 1.16 12/04/2013   BUN 14 12/04/2013   CO2 29 12/04/2013   TSH 1.26 10/06/2011    No results found.  Assessment & Plan:   Problem List Items Addressed This Visit    GAD (generalized anxiety disorder) - Primary    Starting  lexapro 5 mg daily .  Encouraged to resume participation in regular exercise and regular counselling       Loss of weight    I have addressed low  BMI and recommended a high protein diet.  Screening for thyroid and diabetes to be done today.         Relevant Orders   Comprehensive metabolic panel   Hepatitis C antibody   HIV antibody   TSH   CBC with Differential/Platelet    Other Visit Diagnoses    Vitamin D deficiency        Relevant Orders    VITAMIN D 25 Hydroxy (Vit-D Deficiency, Fractures)    Hyperlipidemia        Relevant Orders    Lipid panel        A total of 40 minutes of face to face time was spent with patient more than half of which was spent in counselling and coordination of care   I am having [name] start on escitalopram and ALPRAZolam. I am also having her maintain her calcium citrate-vitamin D, letrozole, NON FORMULARY, and estradiol.  Meds ordered this encounter  Medications  . escitalopram (LEXAPRO) 5 MG tablet    Sig: Take 1 tablet (5 mg total) by mouth daily.    Dispense:  30 tablet    Refill:  0  . ALPRAZolam (XANAX) 0.25 MG tablet    Sig: Take 1 tablet (0.25 mg total) by mouth at bedtime as needed for anxiety or sleep.    Dispense:  30 tablet    Refill:  1    There are no discontinued medications.  Follow-up: Return in about 4 weeks (around 08/13/2015) for cpe with pap .   Crecencio Mc, MD

## 2015-07-16 NOTE — Progress Notes (Signed)
Pre-visit discussion using our clinic review tool. No additional management support is needed unless otherwise documented below in the visit note.  

## 2015-07-16 NOTE — Patient Instructions (Signed)
Starting lexapro 5 mg daily with dinner  Alprazolam 1 tablet at night as needed for insomnia or early waking  RESUME DAILY EXERCISE!!   RETURN FOR FASTING LABS AND ANNUAL EXAM ONE MONTH WITH PAP SMEAR

## 2015-07-17 DIAGNOSIS — R634 Abnormal weight loss: Secondary | ICD-10-CM | POA: Insufficient documentation

## 2015-07-17 NOTE — Assessment & Plan Note (Signed)
I have addressed low  BMI and recommended a high protein diet.  Screening for thyroid and diabetes to be done today.

## 2015-07-17 NOTE — Assessment & Plan Note (Signed)
Starting lexapro 5 mg daily .  Encouraged to resume participation in regular exercise and regular counselling

## 2015-07-18 ENCOUNTER — Encounter: Payer: Self-pay | Admitting: Internal Medicine

## 2015-08-07 ENCOUNTER — Other Ambulatory Visit: Payer: Self-pay | Admitting: Internal Medicine

## 2015-08-07 ENCOUNTER — Encounter: Payer: Self-pay | Admitting: Internal Medicine

## 2015-08-07 DIAGNOSIS — D0512 Intraductal carcinoma in situ of left breast: Secondary | ICD-10-CM

## 2015-08-11 ENCOUNTER — Telehealth: Payer: Self-pay | Admitting: *Deleted

## 2015-08-11 NOTE — Progress Notes (Signed)
Kim Woods this patient is coming in on the 19th for labs , please make sure this gets drawn correctly thanks

## 2015-08-13 ENCOUNTER — Other Ambulatory Visit (INDEPENDENT_AMBULATORY_CARE_PROVIDER_SITE_OTHER): Payer: BLUE CROSS/BLUE SHIELD

## 2015-08-13 DIAGNOSIS — R634 Abnormal weight loss: Secondary | ICD-10-CM

## 2015-08-13 DIAGNOSIS — E559 Vitamin D deficiency, unspecified: Secondary | ICD-10-CM | POA: Diagnosis not present

## 2015-08-13 DIAGNOSIS — D0512 Intraductal carcinoma in situ of left breast: Secondary | ICD-10-CM | POA: Diagnosis not present

## 2015-08-13 DIAGNOSIS — E785 Hyperlipidemia, unspecified: Secondary | ICD-10-CM | POA: Diagnosis not present

## 2015-08-13 LAB — COMPREHENSIVE METABOLIC PANEL
ALK PHOS: 51 U/L (ref 39–117)
ALT: 22 U/L (ref 0–35)
AST: 25 U/L (ref 0–37)
Albumin: 4.4 g/dL (ref 3.5–5.2)
BILIRUBIN TOTAL: 0.9 mg/dL (ref 0.2–1.2)
BUN: 16 mg/dL (ref 6–23)
CO2: 30 mEq/L (ref 19–32)
Calcium: 10 mg/dL (ref 8.4–10.5)
Chloride: 101 mEq/L (ref 96–112)
Creatinine, Ser: 0.99 mg/dL (ref 0.40–1.20)
GFR: 62.31 mL/min (ref >60.00)
GLUCOSE: 85 mg/dL (ref 70–99)
Potassium: 4 mEq/L (ref 3.5–5.1)
SODIUM: 137 meq/L (ref 135–145)
TOTAL PROTEIN: 7.6 g/dL (ref 6.0–8.3)

## 2015-08-13 LAB — CBC WITH DIFFERENTIAL/PLATELET
BASOS ABS: 0 10*3/uL (ref 0.0–0.1)
BASOS PCT: 0.7 % (ref 0.0–3.0)
EOS ABS: 0.1 10*3/uL (ref 0.0–0.7)
Eosinophils Relative: 2.6 % (ref 0.0–5.0)
HCT: 40 % (ref 36.0–46.0)
Hemoglobin: 13.5 g/dL (ref 12.0–15.0)
LYMPHS ABS: 1.8 10*3/uL (ref 0.7–4.0)
LYMPHS PCT: 36.2 % (ref 12.0–46.0)
MCHC: 33.7 g/dL (ref 30.0–36.0)
MCV: 92.9 fl (ref 78.0–100.0)
MONO ABS: 0.4 10*3/uL (ref 0.1–1.0)
Monocytes Relative: 8.7 % (ref 3.0–12.0)
NEUTROS ABS: 2.5 10*3/uL (ref 1.4–7.7)
NEUTROS PCT: 51.8 % (ref 43.0–77.0)
PLATELETS: 214 10*3/uL (ref 150.0–400.0)
RBC: 4.3 Mil/uL (ref 3.87–5.11)
RDW: 12.1 % (ref 11.5–15.5)
WBC: 4.9 10*3/uL (ref 4.0–10.5)

## 2015-08-13 LAB — VITAMIN D 25 HYDROXY (VIT D DEFICIENCY, FRACTURES): VITD: 34.57 ng/mL (ref 30.00–100.00)

## 2015-08-13 LAB — LIPID PANEL
Cholesterol: 162 mg/dL (ref 0–200)
HDL: 75.1 mg/dL (ref >39.00)
LDL Cholesterol: 79 mg/dL (ref 0–99)
NonHDL: 86.94
TRIGLYCERIDES: 41 mg/dL (ref 0.0–149.0)
Total CHOL/HDL Ratio: 2
VLDL: 8.2 mg/dL (ref 0.0–40.0)

## 2015-08-13 LAB — TSH: TSH: 1.26 u[IU]/mL (ref 0.35–4.50)

## 2015-08-14 ENCOUNTER — Other Ambulatory Visit: Payer: BLUE CROSS/BLUE SHIELD

## 2015-08-14 ENCOUNTER — Encounter: Payer: Self-pay | Admitting: Internal Medicine

## 2015-08-14 LAB — CANCER ANTIGEN 27.29: CA 27.29: 12 U/mL (ref <38)

## 2015-08-14 LAB — HEPATITIS C ANTIBODY: HCV AB: NEGATIVE

## 2015-08-14 LAB — HIV ANTIBODY (ROUTINE TESTING W REFLEX): HIV: NONREACTIVE

## 2015-08-18 ENCOUNTER — Other Ambulatory Visit (HOSPITAL_COMMUNITY)
Admission: RE | Admit: 2015-08-18 | Discharge: 2015-08-18 | Disposition: A | Discharge status: Home / Self Care | Payer: BLUE CROSS/BLUE SHIELD | Source: Ambulatory Visit | Attending: Internal Medicine | Admitting: Internal Medicine

## 2015-08-18 ENCOUNTER — Ambulatory Visit (INDEPENDENT_AMBULATORY_CARE_PROVIDER_SITE_OTHER): Payer: BLUE CROSS/BLUE SHIELD | Admitting: Internal Medicine

## 2015-08-18 ENCOUNTER — Encounter: Payer: Self-pay | Admitting: Internal Medicine

## 2015-08-18 VITALS — BP 118/72 | HR 63 | Temp 97.8°F | Resp 12 | Ht 67.75 in | Wt 116.8 lb

## 2015-08-18 DIAGNOSIS — F411 Generalized anxiety disorder: Secondary | ICD-10-CM | POA: Diagnosis not present

## 2015-08-18 DIAGNOSIS — Z124 Encounter for screening for malignant neoplasm of cervix: Secondary | ICD-10-CM

## 2015-08-18 DIAGNOSIS — Z0000 Encounter for general adult medical examination without abnormal findings: Secondary | ICD-10-CM | POA: Diagnosis not present

## 2015-08-18 DIAGNOSIS — Z1151 Encounter for screening for human papillomavirus (HPV): Secondary | ICD-10-CM | POA: Insufficient documentation

## 2015-08-18 DIAGNOSIS — Z01419 Encounter for gynecological examination (general) (routine) without abnormal findings: Secondary | ICD-10-CM | POA: Diagnosis not present

## 2015-08-18 NOTE — Progress Notes (Signed)
Pre-visit discussion using our clinic review tool. No additional management support is needed unless otherwise documented below in the visit note.  

## 2015-08-18 NOTE — Progress Notes (Signed)
Subjective:  Patient ID: Kim Woods, female    DOB: 23-Feb-1961  Age: 55 y.o. MRN: [id number]  CC: The primary encounter diagnosis was Cervical cancer screening. Diagnoses of GAD (generalized anxiety disorder) and Encounter for preventive health examination were also pertinent to this visit.  HPI Kim Woods presents for annual gyn exam with PAP and breast exam requested.,   Outpatient Prescriptions Prior to Visit  Medication Sig Dispense Refill  . ALPRAZolam (XANAX) 0.25 MG tablet Take 1 tablet (0.25 mg total) by mouth at bedtime as needed for anxiety or sleep. 30 tablet 1  . calcium citrate-vitamin D 200-200 MG-UNIT TABS Take 2 tablets by mouth daily.    [name] letrozole (FEMARA) 2.5 MG tablet TAKE 1 TABLET DAILY 90 tablet 0  . NON FORMULARY Place 1 tablet vaginally as needed. estradiol, E3 vaginal tablets    . escitalopram (LEXAPRO) 5 MG tablet Take 1 tablet (5 mg total) by mouth daily. (Patient not taking: Reported on 08/18/2015) 30 tablet 0  . estradiol (ESTRACE VAGINAL) 0.1 MG/GM vaginal cream Place 1 Applicatorful vaginally once a week. (Patient not taking: Reported on 07/16/2015) 42.5 g 0   No facility-administered medications prior to visit.    Review of Systems;  Patient denies headache, fevers, malaise, unintentional weight loss, skin rash, eye pain, sinus congestion and sinus pain, sore throat, dysphagia,  hemoptysis , cough, dyspnea, wheezing, chest pain, palpitations, orthopnea, edema, abdominal pain, nausea, melena, diarrhea, constipation, flank pain, dysuria, hematuria, urinary  Frequency, nocturia, numbness, tingling, seizures,  Focal weakness, Loss of consciousness,  Tremor, insomnia, depression, anxiety, and suicidal ideation.      Objective:  BP 118/72 mmHg  Pulse 63  Temp(Src) 97.8 F (36.6 C) (Oral)  Resp 12  Ht 5' 7.75" (1.721 m)  Wt 116 lb 12 oz (52.957 kg)  BMI 17.88 kg/m2  SpO2 99%  LMP 12/25/2009  BP Readings from Last 3 Encounters:  08/18/15 118/72    07/16/15 98/60  07/02/15 108/68    Wt Readings from Last 3 Encounters:  08/18/15 116 lb 12 oz (52.957 kg)  07/16/15 112 lb 12 oz (51.143 kg)  07/02/15 115 lb (52.164 kg)   General Appearance:    Alert, cooperative, no distress, appears stated age  Head:    Normocephalic, without obvious abnormality, atraumatic  Eyes:    PERRL, conjunctiva/corneas clear, EOM's intact, fundi    benign, both eyes  Ears:    Normal TM's and external ear canals, both ears  Nose:   Nares normal, septum midline, mucosa normal, no drainage    or sinus tenderness  Throat:   Lips, mucosa, and tongue normal; teeth and gums normal  Neck:   Supple, symmetrical, trachea midline, no adenopathy;    thyroid:  no enlargement/tenderness/nodules; no carotid   bruit or JVD  Back:     Symmetric, no curvature, ROM normal, no CVA tenderness  Lungs:     Clear to auscultation bilaterally, respirations unlabored  Chest Wall:    No tenderness or deformity   Heart:    Regular rate and rhythm, S1 and S2 normal, no murmur, rub   or gallop  Breast Exam:    No tenderness, masses, or nipple abnormality  Abdomen:     Soft, non-tender, bowel sounds active all four quadrants,    no masses, no organomegaly  Genitalia:    Pelvic: cervix normal in appearance, external genitalia normal, no adnexal masses or tenderness, no cervical motion tenderness, rectovaginal septum normal, uterus normal size, shape, and consistency and  vagina normal without discharge  Extremities:   Extremities normal, atraumatic, no cyanosis or edema  Pulses:   2+ and symmetric all extremities  Skin:   Skin color, texture, turgor normal, no rashes or lesions  Lymph nodes:   Cervical, supraclavicular, and axillary nodes normal  Neurologic:   CNII-XII intact, normal strength, sensation and reflexes    throughout   No results found for: HGBA1C  Lab Results  Component Value Date   CREATININE 0.99 08/13/2015   CREATININE 1.16 12/04/2013   CREATININE 1.04  06/14/2013    Lab Results  Component Value Date   WBC 4.9 08/13/2015   HGB 13.5 08/13/2015   HCT 40.0 08/13/2015   PLT 214.0 08/13/2015   GLUCOSE 85 08/13/2015   CHOL 162 08/13/2015   TRIG 41.0 08/13/2015   HDL 75.10 08/13/2015   LDLCALC 79 08/13/2015   ALT 22 08/13/2015   AST 25 08/13/2015   NA 137 08/13/2015   K 4.0 08/13/2015   CL 101 08/13/2015   CREATININE 0.99 08/13/2015   BUN 16 08/13/2015   CO2 30 08/13/2015   TSH 1.26 08/13/2015    No results found.  Assessment & Plan:   Problem List Items Addressed This Visit    Encounter for preventive health examination    Annual comprehensive preventive exam was done as well as an evaluation and management of chronic conditions .  During the course of the visit the patient was educated and counseled about appropriate screening and preventive services including :  diabetes screening, lipid analysis with projected  10 year  risk for CAD , nutrition counseling, breast, cervical and colorectal cancer screening, and recommended immunizations.  Printed recommendations for health maintenance screenings was given.  Lab Results  Component Value Date   TSH 1.26 08/13/2015   .[email or web address] Lab Results  Component Value Date   CHOL 162 08/13/2015   HDL 75.10 08/13/2015   LDLCALC 79 08/13/2015   TRIG 41.0 08/13/2015   CHOLHDL 2 08/13/2015         GAD (generalized anxiety disorder)    Advised to resume Lexapro at 1/2 tablet daily        Other Visit Diagnoses    Cervical cancer screening    -  Primary    Relevant Orders    Cytology - PAP (Completed)       I am having Ms. Bines maintain her calcium citrate-vitamin D, letrozole, NON FORMULARY, estradiol, escitalopram, and ALPRAZolam.  No orders of the defined types were placed in this encounter.    There are no discontinued medications.  Follow-up: Return for daughter Judson Roch needs late may appt .   Crecencio Mc, MD

## 2015-08-18 NOTE — Patient Instructions (Signed)
I suggest resuming Lexapro at 1/2 tablet daily for a week .  If you are tolerating it,  Then increase to a full tablet  If you do not tolerate full tablet, we can continue 1/2 tablet or try something else!   Menopause is a normal process in which your reproductive ability comes to an end. This process happens gradually over a span of months to years, usually between the ages of 26 and 31. Menopause is complete when you have missed 12 consecutive menstrual periods. It is important to talk with your health care provider about some of the most common conditions that affect postmenopausal women, such as heart disease, cancer, and bone loss (osteoporosis). Adopting a healthy lifestyle and getting preventive care can help to promote your health and wellness. Those actions can also lower your chances of developing some of these common conditions. WHAT SHOULD I KNOW ABOUT MENOPAUSE? During menopause, you may experience a number of symptoms, such as:  Moderate-to-severe hot flashes.  Night sweats.  Decrease in sex drive.  Mood swings.  Headaches.  Tiredness.  Irritability.  Memory problems.  Insomnia. Choosing to treat or not to treat menopausal changes is an individual decision that you make with your health care provider. WHAT SHOULD I KNOW ABOUT HORMONE REPLACEMENT THERAPY AND SUPPLEMENTS? Hormone therapy products are effective for treating symptoms that are associated with menopause, such as hot flashes and night sweats. Hormone replacement carries certain risks, especially as you become older. If you are thinking about using estrogen or estrogen with progestin treatments, discuss the benefits and risks with your health care provider. WHAT SHOULD I KNOW ABOUT HEART DISEASE AND STROKE? Heart disease, heart attack, and stroke become more likely as you age. This may be due, in part, to the hormonal changes that your body experiences during menopause. These can affect how your body processes  dietary fats, triglycerides, and cholesterol. Heart attack and stroke are both medical emergencies. There are many things that you can do to help prevent heart disease and stroke:  Have your blood pressure checked at least every 1-2 years. High blood pressure causes heart disease and increases the risk of stroke.  If you are 90-60 years old, ask your health care provider if you should take aspirin to prevent a heart attack or a stroke.  Do not use any tobacco products, including cigarettes, chewing tobacco, or electronic cigarettes. If you need help quitting, ask your health care provider.  It is important to eat a healthy diet and maintain a healthy weight.  Be sure to include plenty of vegetables, fruits, low-fat dairy products, and lean protein.  Avoid eating foods that are high in solid fats, added sugars, or salt (sodium).  Get regular exercise. This is one of the most important things that you can do for your health.  Try to exercise for at least 150 minutes each week. The type of exercise that you do should increase your heart rate and make you sweat. This is known as moderate-intensity exercise.  Try to do strengthening exercises at least twice each week. Do these in addition to the moderate-intensity exercise.  Know your numbers.Ask your health care provider to check your cholesterol and your blood glucose. Continue to have your blood tested as directed by your health care provider. WHAT SHOULD I KNOW ABOUT CANCER SCREENING? There are several types of cancer. Take the following steps to reduce your risk and to catch any cancer development as early as possible. Breast Cancer  Practice breast self-awareness.  This means understanding how your breasts normally appear and feel.  It also means doing regular breast self-exams. Let your health care provider know about any changes, no matter how small.  If you are 40 or older, have a clinician do a breast exam (clinical breast exam  or CBE) every year. Depending on your age, family history, and medical history, it may be recommended that you also have a yearly breast X-ray (mammogram).  If you have a family history of breast cancer, talk with your health care provider about genetic screening.  If you are at high risk for breast cancer, talk with your health care provider about having an MRI and a mammogram every year.  Breast cancer (BRCA) gene test is recommended for women who have family members with BRCA-related cancers. Results of the assessment will determine the need for genetic counseling and BRCA1 and for BRCA2 testing. BRCA-related cancers include these types:  Breast. This occurs in males or females.  Ovarian.  Tubal. This may also be called fallopian tube cancer.  Cancer of the abdominal or pelvic lining (peritoneal cancer).  Prostate.  Pancreatic. Cervical, Uterine, and Ovarian Cancer Your health care provider may recommend that you be screened regularly for cancer of the pelvic organs. These include your ovaries, uterus, and vagina. This screening involves a pelvic exam, which includes checking for microscopic changes to the surface of your cervix (Pap test).  For women ages 21-65, health care providers may recommend a pelvic exam and a Pap test every three years. For women ages 80-65, they may recommend the Pap test and pelvic exam, combined with testing for human papilloma virus (HPV), every five years. Some types of HPV increase your risk of cervical cancer. Testing for HPV may also be done on women of any age who have unclear Pap test results.  Other health care providers may not recommend any screening for nonpregnant women who are considered low risk for pelvic cancer and have no symptoms. Ask your health care provider if a screening pelvic exam is right for you.  If you have had past treatment for cervical cancer or a condition that could lead to cancer, you need Pap tests and screening for cancer  for at least 20 years after your treatment. If Pap tests have been discontinued for you, your risk factors (such as having a new sexual partner) need to be reassessed to determine if you should start having screenings again. Some women have medical problems that increase the chance of getting cervical cancer. In these cases, your health care provider may recommend that you have screening and Pap tests more often.  If you have a family history of uterine cancer or ovarian cancer, talk with your health care provider about genetic screening.  If you have vaginal bleeding after reaching menopause, tell your health care provider.  There are currently no reliable tests available to screen for ovarian cancer. Lung Cancer Lung cancer screening is recommended for adults 33-40 years old who are at high risk for lung cancer because of a history of smoking. A yearly low-dose CT scan of the lungs is recommended if you:  Currently smoke.  Have a history of at least 30 pack-years of smoking and you currently smoke or have quit within the past 15 years. A pack-year is smoking an average of one pack of cigarettes per day for one year. Yearly screening should:  Continue until it has been 15 years since you quit.  Stop if you develop a health problem  that would prevent you from having lung cancer treatment. Colorectal Cancer  This type of cancer can be detected and can often be prevented.  Routine colorectal cancer screening usually begins at age 66 and continues through age 81.  If you have risk factors for colon cancer, your health care provider may recommend that you be screened at an earlier age.  If you have a family history of colorectal cancer, talk with your health care provider about genetic screening.  Your health care provider may also recommend using home test kits to check for hidden blood in your stool.  A small camera at the end of a tube can be used to examine your colon directly  (sigmoidoscopy or colonoscopy). This is done to check for the earliest forms of colorectal cancer.  Direct examination of the colon should be repeated every 5-10 years until age 16. However, if early forms of precancerous polyps or small growths are found or if you have a family history or genetic risk for colorectal cancer, you may need to be screened more often. Skin Cancer  Check your skin from head to toe regularly.  Monitor any moles. Be sure to tell your health care provider:  About any new moles or changes in moles, especially if there is a change in a mole's shape or color.  If you have a mole that is larger than the size of a pencil eraser.  If any of your family members has a history of skin cancer, especially at a young age, talk with your health care provider about genetic screening.  Always use sunscreen. Apply sunscreen liberally and repeatedly throughout the day.  Whenever you are outside, protect yourself by wearing long sleeves, pants, a wide-brimmed hat, and sunglasses. WHAT SHOULD I KNOW ABOUT OSTEOPOROSIS? Osteoporosis is a condition in which bone destruction happens more quickly than new bone creation. After menopause, you may be at an increased risk for osteoporosis. To help prevent osteoporosis or the bone fractures that can happen because of osteoporosis, the following is recommended:  If you are 65-62 years old, get at least 1,000 mg of calcium and at least 600 mg of vitamin D per day.  If you are older than age 50 but younger than age 55, get at least 1,200 mg of calcium and at least 600 mg of vitamin D per day.  If you are older than age 28, get at least 1,200 mg of calcium and at least 800 mg of vitamin D per day. Smoking and excessive alcohol intake increase the risk of osteoporosis. Eat foods that are rich in calcium and vitamin D, and do weight-bearing exercises several times each week as directed by your health care provider. WHAT SHOULD I KNOW ABOUT HOW  MENOPAUSE AFFECTS [place]? Depression may occur at any age, but it is more common as you become older. Common symptoms of depression include:  Low or sad mood.  Changes in sleep patterns.  Changes in appetite or eating patterns.  Feeling an overall lack of motivation or enjoyment of activities that you previously enjoyed.  Frequent crying spells. Talk with your health care provider if you think that you are experiencing depression. WHAT SHOULD I KNOW ABOUT IMMUNIZATIONS? It is important that you get and maintain your immunizations. These include:  Tetanus, diphtheria, and pertussis (Tdap) booster vaccine.  Influenza every year before the flu season begins.  Pneumonia vaccine.  Shingles vaccine. Your health care provider may also recommend other immunizations.   This information is  not intended to replace advice given to you by your health care provider. Make sure you discuss any questions you have with your health care provider.   Document Released: 06/04/2005 Document Revised: 05/03/2014 Document Reviewed: 12/13/2013 Elsevier Interactive Patient Education [employer].

## 2015-08-19 LAB — CYTOLOGY - PAP

## 2015-08-19 NOTE — Assessment & Plan Note (Signed)
Advised to resume Lexapro at 1/2 tablet daily

## 2015-08-19 NOTE — Assessment & Plan Note (Signed)
Annual comprehensive preventive exam was done as well as an evaluation and management of chronic conditions .  During the course of the visit the patient was educated and counseled about appropriate screening and preventive services including :  diabetes screening, lipid analysis with projected  10 year  risk for CAD , nutrition counseling, breast, cervical and colorectal cancer screening, and recommended immunizations.  Printed recommendations for health maintenance screenings was given.  Lab Results  Component Value Date   TSH 1.26 08/13/2015   .lastaqc Lab Results  Component Value Date   CHOL 162 08/13/2015   HDL 75.10 08/13/2015   LDLCALC 79 08/13/2015   TRIG 41.0 08/13/2015   CHOLHDL 2 08/13/2015

## 2015-08-20 ENCOUNTER — Encounter: Payer: Self-pay | Admitting: Internal Medicine

## 2015-08-25 DIAGNOSIS — F4320 Adjustment disorder, unspecified: Secondary | ICD-10-CM | POA: Diagnosis not present

## 2015-09-09 DIAGNOSIS — F4320 Adjustment disorder, unspecified: Secondary | ICD-10-CM | POA: Diagnosis not present

## 2015-09-15 ENCOUNTER — Encounter: Payer: Self-pay | Admitting: Internal Medicine

## 2015-09-23 DIAGNOSIS — F4320 Adjustment disorder, unspecified: Secondary | ICD-10-CM | POA: Diagnosis not present

## 2015-10-23 ENCOUNTER — Encounter: Payer: Self-pay | Admitting: Internal Medicine

## 2015-11-25 ENCOUNTER — Telehealth: Payer: Self-pay | Admitting: *Deleted

## 2015-11-25 ENCOUNTER — Encounter: Payer: Self-pay | Admitting: Internal Medicine

## 2015-11-25 MED ORDER — LETROZOLE 2.5 MG PO TABS: 2.5000 mg | ORAL_TABLET | Freq: Every day | ORAL | 3 refills | Status: DC

## 2015-11-25 NOTE — Telephone Encounter (Signed)
[facility]ara sent in [facility]. 3 Estradiol vag tab refills called in to [facility] as requested.

## 2015-11-25 NOTE — Telephone Encounter (Signed)
Patient called and states she needs a refill of her Femara . Pt pharmacy is [facility]. She needs a refill of her Estradiol . She get that medication filled through [facility]. Pt states if you have any questions you can give her a call. Her call back number is [phone]. Thanks

## 2016-01-06 ENCOUNTER — Encounter: Payer: Self-pay | Admitting: Internal Medicine

## 2016-01-06 MED ORDER — HYOSCYAMINE SULFATE 0.125 MG PO TBDP
0.1250 mg | ORAL_TABLET | ORAL | 0 refills | Status: DC | PRN
Start: 2016-01-06 — End: 2016-07-05

## 2016-01-06 NOTE — Telephone Encounter (Signed)
It will be faxed /called in this evening.   Regards,   Deborra Medina, MD

## 2016-01-07 DIAGNOSIS — H903 Sensorineural hearing loss, bilateral: Secondary | ICD-10-CM | POA: Diagnosis not present

## 2016-01-07 DIAGNOSIS — H9319 Tinnitus, unspecified ear: Secondary | ICD-10-CM | POA: Diagnosis not present

## 2016-01-29 ENCOUNTER — Telehealth: Payer: Self-pay | Admitting: *Deleted

## 2016-01-29 NOTE — Telephone Encounter (Signed)
Pt called asking about a possible cyst on the back side of her wrist. Advised to start with Dr Derrel Nip and if referral is needed we would be glad to see her. She was concerned because of her breast cancer history but realized this was a different issue.

## 2016-02-07 ENCOUNTER — Ambulatory Visit (INDEPENDENT_AMBULATORY_CARE_PROVIDER_SITE_OTHER): Payer: BLUE CROSS/BLUE SHIELD

## 2016-02-07 DIAGNOSIS — Z23 Encounter for immunization: Secondary | ICD-10-CM

## 2016-03-02 DIAGNOSIS — D225 Melanocytic nevi of trunk: Secondary | ICD-10-CM | POA: Diagnosis not present

## 2016-03-02 DIAGNOSIS — L814 Other melanin hyperpigmentation: Secondary | ICD-10-CM | POA: Diagnosis not present

## 2016-03-02 DIAGNOSIS — D2261 Melanocytic nevi of right upper limb, including shoulder: Secondary | ICD-10-CM | POA: Diagnosis not present

## 2016-03-02 DIAGNOSIS — D2371 Other benign neoplasm of skin of right lower limb, including hip: Secondary | ICD-10-CM | POA: Diagnosis not present

## 2016-04-09 ENCOUNTER — Encounter: Payer: Self-pay | Admitting: Internal Medicine

## 2016-04-09 DIAGNOSIS — R634 Abnormal weight loss: Secondary | ICD-10-CM

## 2016-04-09 DIAGNOSIS — R636 Underweight: Secondary | ICD-10-CM

## 2016-04-09 DIAGNOSIS — Z0000 Encounter for general adult medical examination without abnormal findings: Secondary | ICD-10-CM

## 2016-04-09 NOTE — Telephone Encounter (Signed)
I have pended lab and scheduled lab appointment for  04/29/16 @ 9

## 2016-05-20 ENCOUNTER — Other Ambulatory Visit: Payer: Self-pay

## 2016-05-20 DIAGNOSIS — C50412 Malignant neoplasm of upper-outer quadrant of left female breast: Secondary | ICD-10-CM

## 2016-05-27 ENCOUNTER — Other Ambulatory Visit: Payer: Self-pay

## 2016-05-27 DIAGNOSIS — C50412 Malignant neoplasm of upper-outer quadrant of left female breast: Secondary | ICD-10-CM

## 2016-06-01 DIAGNOSIS — F4320 Adjustment disorder, unspecified: Secondary | ICD-10-CM | POA: Diagnosis not present

## 2016-06-01 DIAGNOSIS — F4322 Adjustment disorder with anxiety: Secondary | ICD-10-CM | POA: Diagnosis not present

## 2016-06-08 ENCOUNTER — Encounter: Payer: Self-pay | Admitting: Internal Medicine

## 2016-06-08 DIAGNOSIS — F4322 Adjustment disorder with anxiety: Secondary | ICD-10-CM | POA: Diagnosis not present

## 2016-06-08 DIAGNOSIS — F4320 Adjustment disorder, unspecified: Secondary | ICD-10-CM | POA: Diagnosis not present

## 2016-06-16 ENCOUNTER — Encounter: Payer: Self-pay | Admitting: Internal Medicine

## 2016-06-21 DIAGNOSIS — F4322 Adjustment disorder with anxiety: Secondary | ICD-10-CM | POA: Diagnosis not present

## 2016-06-21 DIAGNOSIS — F4320 Adjustment disorder, unspecified: Secondary | ICD-10-CM | POA: Diagnosis not present

## 2016-06-28 ENCOUNTER — Ambulatory Visit
Admission: RE | Admit: 2016-06-28 | Discharge: 2016-06-28 | Disposition: A | Discharge status: Home / Self Care | Payer: BLUE CROSS/BLUE SHIELD | Source: Ambulatory Visit | Attending: General Surgery | Admitting: General Surgery

## 2016-06-28 ENCOUNTER — Other Ambulatory Visit
Admission: RE | Admit: 2016-06-28 | Discharge: 2016-06-28 | Disposition: A | Discharge status: Home / Self Care | Payer: BLUE CROSS/BLUE SHIELD | Source: Ambulatory Visit | Attending: General Surgery | Admitting: General Surgery

## 2016-06-28 ENCOUNTER — Other Ambulatory Visit: Payer: Self-pay | Admitting: General Surgery

## 2016-06-28 DIAGNOSIS — C50412 Malignant neoplasm of upper-outer quadrant of left female breast: Secondary | ICD-10-CM | POA: Diagnosis not present

## 2016-06-28 DIAGNOSIS — R922 Inconclusive mammogram: Secondary | ICD-10-CM | POA: Diagnosis not present

## 2016-06-29 LAB — CANCER ANTIGEN 27.29: CA 27.29: 13 U/mL (ref 0.0–38.6)

## 2016-07-05 ENCOUNTER — Ambulatory Visit (INDEPENDENT_AMBULATORY_CARE_PROVIDER_SITE_OTHER): Payer: BLUE CROSS/BLUE SHIELD | Admitting: General Surgery

## 2016-07-05 ENCOUNTER — Encounter: Payer: Self-pay | Admitting: *Deleted

## 2016-07-05 ENCOUNTER — Encounter: Payer: Self-pay | Admitting: General Surgery

## 2016-07-05 VITALS — BP 122/64 | HR 82 | Resp 14 | Ht 67.0 in | Wt 117.0 lb

## 2016-07-05 DIAGNOSIS — Z853 Personal history of malignant neoplasm of breast: Secondary | ICD-10-CM | POA: Diagnosis not present

## 2016-07-05 MED ORDER — ESTRADIOL 0.1 MG/GM VA CREA: 1.0000 | TOPICAL_CREAM | VAGINAL | 0 refills | Status: DC

## 2016-07-05 NOTE — Progress Notes (Signed)
Patient ID: Kim Woods, female   DOB: 05/09/1960, 56 y.o.   MRN: [id number]  Chief Complaint  Patient presents with  . Follow-up    mammogram    HPI Kim Woods is a 56 y.o. female who presents for a breast evaluation. The most recent mammogram was done on 06/28/2016  Patient does perform regular self breast checks and gets regular mammograms done.  Patient states no new breast issues. She did get her CA 27.29 completed in 06/28/16. Patient states she is doing well.  HPI  Past Medical History:  Diagnosis Date  . Asthma    precipitated by seasonal allergies   . 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 BRCA 1-2: No mutation. Treated with wide local excision, sentinel node biopsy and whole breast radiation.  . Colon polyp 04-07-15   TUBULAR ADENOMA  . Hemorrhoids   . Hx of radiation therapy 08/30/12- 10/04/12   left breast 50.4 gy 28 fx, upper outer aspect  boosted 63 gy  . Irritable bowel syndrome 2007   GI eval with colonoscopy [facility]  . Patient underweight   . Stress fracture of foot     Past Surgical History:  Procedure Laterality Date  . BREAST BIOPSY Left 2006   benign  . BREAST BIOPSY Left 2014   positive  . BREAST LUMPECTOMY Left 2014  . BREAST SURGERY  2004   left, normal age 67, incisional bx  . COLONOSCOPY  2006   Dr. Vira Agar  . COLONOSCOPY N/A 04/07/2015   Procedure: COLONOSCOPY;  Surgeon: Manya Silvas, MD;  Location: [facility] ENDOSCOPY;  Service: Endoscopy;  Laterality: N/A;  . ENDOMETRIAL ABLATION  Sept 2011   [facility]  . EXPLORATORY LAPAROTOMY  Sept 2011  . LYMPH NODE BIOPSY Left 07/12/2012  . SENTINEL LYMPH NODE BIOPSY Left 06/27/2012   incisional bx/lumpectomy    Family History  Problem Relation Age of Onset  . Cancer Mother     uterine  . Mental illness Father 61    parkinsons disease  . Cancer Paternal Grandmother     ostesarcoma hip,     Social History Social History  Substance  Use Topics  . Smoking status: Never Smoker  . Smokeless tobacco: Never Used  . Alcohol use 4.2 oz/week    7 Glasses of wine per week    Allergies  Allergen Reactions  . Ciprofloxacin Other (See Comments)    Numbness and tingling  . Nitrofurantoin Monohyd Macro     Numbness, tingling in hands and feet  . Cefuroxime Axetil Rash    Current Outpatient Prescriptions  Medication Sig Dispense Refill  . ALPRAZolam (XANAX) 0.25 MG tablet Take 1 tablet (0.25 mg total) by mouth at bedtime as needed for anxiety or sleep. 30 tablet 1  . calcium citrate-vitamin D 200-200 MG-UNIT TABS Take 2 tablets by mouth daily.    [name] estradiol (ESTRACE VAGINAL) 0.1 MG/GM vaginal cream Place 1 Applicatorful vaginally once a week. 42.5 g 0  . letrozole (FEMARA) 2.5 MG tablet Take 1 tablet (2.5 mg total) by mouth daily. 90 tablet 3  . NON FORMULARY Place 1 tablet vaginally as needed. estradiol, E3 vaginal tablets     No current facility-administered medications for this visit.     Review of Systems Review of Systems  Constitutional: Negative.   Respiratory: Negative.     Blood pressure 122/64, pulse 82, resp. rate 14, height _0  (1.702 m), weight 117  lb (53.1 kg), last menstrual period 12/25/2009.  Physical Exam Physical Exam  Constitutional: She is oriented to person, place, and time. She appears well-developed and well-nourished.  Eyes: Conjunctivae are normal. No scleral icterus.  Neck: Neck supple.  Cardiovascular: Normal rate, regular rhythm and normal heart sounds.   Pulmonary/Chest: Effort normal and breath sounds normal. Right breast exhibits no inverted nipple, no mass, no nipple discharge, no skin change and no tenderness. Left breast exhibits no inverted nipple, no mass, no nipple discharge, no skin change (slight indentation outer quadrant due to radiation changes) and no tenderness.    Minimal thickening in left breast   Abdominal: Soft. Bowel sounds are normal. There is no tenderness.   Lymphadenopathy:    She has no cervical adenopathy.    She has no axillary adenopathy.  Neurological: She is alert and oriented to person, place, and time.  Skin: Skin is warm and dry.    Data Reviewed Bilateral mammograms dated 06/28/2016 were reviewed. Postsurgical changes. BI-RADS-2. Bone density exam dated 01/29/2014 was normal.   Assessment    Normal breast exam now almost 4 years out from treatment of a 7 mm tubular carcinoma.     Plan    The patient continues to tolerate her Femara well. We'll arrange for a bone density test in the near future.  She continues to obtain great benefit from the monthly application of Estrace cream in the vaginal area. We'll continue this as well. We discussed that the theoretical risk of absorption of asked to encounter acting her Femara treatment. Considering her once a month usage I don't think this is going to be much of an issue.   Patient will be scheduled to get a bone density test completed.  Patient will be asked to return to the office in one year with a bilateral diagnostic  mammogram.      This information has been scribed by Verlene Mayer, CMA.    Robert Bellow 07/05/2016, 4:51 PM

## 2016-07-05 NOTE — Patient Instructions (Addendum)
Patient will be asked to return to the office in one year with a bilateral screening mammogram.  Patient will be scheduled to get a bone density test completed.

## 2016-07-13 DIAGNOSIS — F4320 Adjustment disorder, unspecified: Secondary | ICD-10-CM | POA: Diagnosis not present

## 2016-07-13 DIAGNOSIS — F4322 Adjustment disorder with anxiety: Secondary | ICD-10-CM | POA: Diagnosis not present

## 2016-07-27 DIAGNOSIS — F4322 Adjustment disorder with anxiety: Secondary | ICD-10-CM | POA: Diagnosis not present

## 2016-07-27 DIAGNOSIS — F4320 Adjustment disorder, unspecified: Secondary | ICD-10-CM | POA: Diagnosis not present

## 2016-07-31 ENCOUNTER — Other Ambulatory Visit: Payer: Self-pay | Admitting: General Surgery

## 2016-08-11 ENCOUNTER — Ambulatory Visit
Admission: RE | Admit: 2016-08-11 | Discharge: 2016-08-11 | Disposition: A | Discharge status: Home / Self Care | Payer: BLUE CROSS/BLUE SHIELD | Source: Ambulatory Visit | Attending: General Surgery | Admitting: General Surgery

## 2016-08-11 DIAGNOSIS — Z853 Personal history of malignant neoplasm of breast: Secondary | ICD-10-CM | POA: Diagnosis not present

## 2016-08-11 DIAGNOSIS — Z780 Asymptomatic menopausal state: Secondary | ICD-10-CM | POA: Insufficient documentation

## 2016-08-25 DIAGNOSIS — F4322 Adjustment disorder with anxiety: Secondary | ICD-10-CM | POA: Diagnosis not present

## 2016-08-25 DIAGNOSIS — F4320 Adjustment disorder, unspecified: Secondary | ICD-10-CM | POA: Diagnosis not present

## 2016-09-16 DIAGNOSIS — F4320 Adjustment disorder, unspecified: Secondary | ICD-10-CM | POA: Diagnosis not present

## 2016-09-16 DIAGNOSIS — F4322 Adjustment disorder with anxiety: Secondary | ICD-10-CM | POA: Diagnosis not present

## 2016-09-30 ENCOUNTER — Encounter: Payer: Self-pay | Admitting: Family Medicine

## 2016-09-30 ENCOUNTER — Ambulatory Visit (INDEPENDENT_AMBULATORY_CARE_PROVIDER_SITE_OTHER): Payer: BLUE CROSS/BLUE SHIELD | Admitting: Family Medicine

## 2016-09-30 VITALS — BP 102/70 | HR 73 | Temp 97.7°F | Wt 116.2 lb

## 2016-09-30 DIAGNOSIS — R300 Dysuria: Secondary | ICD-10-CM

## 2016-09-30 DIAGNOSIS — N390 Urinary tract infection, site not specified: Secondary | ICD-10-CM | POA: Insufficient documentation

## 2016-09-30 DIAGNOSIS — N3001 Acute cystitis with hematuria: Secondary | ICD-10-CM | POA: Diagnosis not present

## 2016-09-30 LAB — POCT URINALYSIS DIPSTICK
Bilirubin, UA: NEGATIVE
Blood, UA: POSITIVE
Glucose, UA: NEGATIVE
KETONES UA: NEGATIVE
LEUKOCYTES UA: NEGATIVE
Nitrite, UA: NEGATIVE
PH UA: 6 (ref 5.0–8.0)
PROTEIN UA: NEGATIVE
SPEC GRAV UA: 1.01 (ref 1.010–1.025)
UROBILINOGEN UA: 0.2 U/dL

## 2016-09-30 LAB — URINALYSIS, MICROSCOPIC ONLY

## 2016-09-30 MED ORDER — PHENAZOPYRIDINE HCL 95 MG PO TABS: 190.0000 mg | ORAL_TABLET | Freq: Three times a day (TID) | ORAL | 0 refills | Status: DC | PRN

## 2016-09-30 MED ORDER — SULFAMETHOXAZOLE-TRIMETHOPRIM 800-160 MG PO TABS: 1.0000 | ORAL_TABLET | Freq: Two times a day (BID) | ORAL | 0 refills | Status: DC

## 2016-09-30 NOTE — Patient Instructions (Signed)
Nice to see you. We will start you on Bactrim for your possible UTI. We'll send your urine for culture and call you if any changes need to be made. If you develop fevers or abdominal pain please seek medical attention.

## 2016-09-30 NOTE — Progress Notes (Signed)
  Tommi Rumps, MD Phone: [phone]  Kim Woods is a 56 y.o. female who presents today for same-day visit.  Patient notes dysuria and hematuria for one day. Notes some urgency and frequency as well. Minimal low back discomfort though that has been a chronic issue. No vaginal discharge or abdominal pain. No fevers. No numbness or weakness. No loss of bowel or bladder function. No saddle anesthesia. Does have a history of UTI.  PMH: History of UTI in the past. No history of kidney stone.   ROS see history of present illness  Objective  Physical Exam Vitals:   09/30/16 0834  BP: 102/70  Pulse: 73  Temp: 97.7 F (36.5 C)    BP Readings from Last 3 Encounters:  09/30/16 102/70  07/05/16 122/64  08/18/15 118/72   Wt Readings from Last 3 Encounters:  09/30/16 116 lb 3.2 oz (52.7 kg)  07/05/16 117 lb (53.1 kg)  08/18/15 116 lb 12 oz (53 kg)    Physical Exam  Constitutional: No distress.  Cardiovascular: Normal rate, regular rhythm and normal heart sounds.   Pulmonary/Chest: Effort normal and breath sounds normal.  Abdominal: Soft. Bowel sounds are normal. She exhibits no distension. There is no tenderness. There is no rebound and no guarding.  Musculoskeletal:  No midline spine tenderness, no midline spine step-off, no muscular back tenderness  Neurological: She is alert. Gait normal.  Skin: Skin is warm and dry. She is not diaphoretic.     Assessment/Plan: Please see individual problem list.  UTI (urinary tract infection) Patient with UTI symptoms. UA with blood. We'll proceed with treatment with Bactrim given her other allergies. Pyridium for discomfort. We'll send urine for culture and microscopy. Given return precautions.   Orders Placed This Encounter  Procedures  . Urine Culture  . Urine Microscopic Only  . POCT urinalysis dipstick    Meds ordered this encounter  Medications  . sulfamethoxazole-trimethoprim (BACTRIM DS,SEPTRA DS) 800-160 MG tablet   Sig: Take 1 tablet by mouth 2 (two) times daily.    Dispense:  6 tablet    Refill:  0  . phenazopyridine (PYRIDIUM) 95 MG tablet    Sig: Take 2 tablets (190 mg total) by mouth 3 (three) times daily as needed for pain.    Dispense:  20 tablet    Refill:  0   Tommi Rumps, MD [place]

## 2016-09-30 NOTE — Assessment & Plan Note (Addendum)
Patient with UTI symptoms. UA with blood. We'll proceed with treatment with Bactrim given her other allergies. Pyridium for discomfort. We'll send urine for culture and microscopy. Given return precautions.

## 2016-10-01 LAB — URINE CULTURE

## 2017-01-06 ENCOUNTER — Ambulatory Visit: Payer: BLUE CROSS/BLUE SHIELD | Admitting: Family

## 2017-01-19 NOTE — Telephone Encounter (Signed)
Error

## 2017-01-24 NOTE — Telephone Encounter (Signed)
Error

## 2017-02-01 ENCOUNTER — Ambulatory Visit: Payer: BLUE CROSS/BLUE SHIELD | Admitting: Podiatry

## 2017-02-01 ENCOUNTER — Telehealth: Payer: Self-pay | Admitting: Internal Medicine
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
## 2017-02-02 NOTE — Telephone Encounter (Signed)
Patient's information sent to charge corrections to have no show fee removed .

## 2017-02-03 ENCOUNTER — Ambulatory Visit: Payer: BLUE CROSS/BLUE SHIELD

## 2017-02-09 ENCOUNTER — Ambulatory Visit (INDEPENDENT_AMBULATORY_CARE_PROVIDER_SITE_OTHER): Payer: BLUE CROSS/BLUE SHIELD | Admitting: *Deleted

## 2017-02-09 DIAGNOSIS — Z23 Encounter for immunization: Secondary | ICD-10-CM

## 2017-02-15 ENCOUNTER — Ambulatory Visit: Payer: BLUE CROSS/BLUE SHIELD | Admitting: Podiatry

## 2017-02-24 ENCOUNTER — Other Ambulatory Visit: Payer: Self-pay

## 2017-02-25 ENCOUNTER — Ambulatory Visit (INDEPENDENT_AMBULATORY_CARE_PROVIDER_SITE_OTHER): Payer: BLUE CROSS/BLUE SHIELD | Admitting: Podiatry

## 2017-02-25 DIAGNOSIS — L603 Nail dystrophy: Secondary | ICD-10-CM | POA: Diagnosis not present

## 2017-02-25 NOTE — Progress Notes (Signed)
   Subjective:    Patient ID: Kim Woods, female    DOB: April 16, 1961, 56 y.o.   MRN: [id number]  HPI    Review of Systems     Objective:   Physical Exam        Assessment & Plan:

## 2017-02-28 NOTE — Progress Notes (Signed)
   Subjective: Patient presents today for possible treatment and evaluation of fungal nails to the bilateral great toes. Patient states that the nails have been discolored and thickened for greater than 1 month. She states she has been treated with Jublia in the past. Patient presents today for further treatment and evaluation.   Past Medical History:  Diagnosis Date  . Asthma    precipitated by seasonal allergies   . 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 BRCA 1-2: No mutation. Treated with wide local excision, sentinel node biopsy and whole breast radiation.  . Colon polyp 04-07-15   TUBULAR ADENOMA  . Hemorrhoids   . Hx of radiation therapy 08/30/12- 10/04/12   left breast 50.4 gy 28 fx, upper outer aspect  boosted 63 gy  . Irritable bowel syndrome 2007   GI eval with colonoscopy [facility]  . Patient underweight   . Stress fracture of foot     Objective: Physical Exam General: The patient is alert and oriented x3 in no acute distress.  Dermatology: Hyperkeratotic, discolored, thickened, onychodystrophy of bilateral great toenails. Skin is warm, dry and supple bilateral lower extremities. Negative for open lesions or macerations.  Vascular: Palpable pedal pulses bilaterally. No edema or erythema noted. Capillary refill within normal limits.  Neurological: Epicritic and protective threshold grossly intact bilaterally.   Musculoskeletal Exam: Range of motion within normal limits to all pedal and ankle joints bilateral. Muscle strength 5/5 in all groups bilateral.   Assessment: #1 onychodystrophy bilateral great toenails #2 hyperkeratotic nails bilateral  Plan of Care:  #1 Patient was evaluated. #2 Recommended OTC Biotin supplement. #3 Recommended topical Vitamin E oil. #4 Return to clinic when necessary.  Edrick Kins, DPM [facility]  Dr. Edrick Kins, [place]                                         [place], [place] [address]                Office [phone]  Fax [phone]

## 2017-03-08 ENCOUNTER — Encounter: Payer: Self-pay | Admitting: Internal Medicine

## 2017-03-08 ENCOUNTER — Ambulatory Visit (INDEPENDENT_AMBULATORY_CARE_PROVIDER_SITE_OTHER): Payer: BLUE CROSS/BLUE SHIELD | Admitting: Internal Medicine

## 2017-03-08 VITALS — BP 108/66 | HR 71 | Temp 97.8°F | Resp 15 | Ht 67.0 in | Wt 115.4 lb

## 2017-03-08 DIAGNOSIS — Z0000 Encounter for general adult medical examination without abnormal findings: Secondary | ICD-10-CM

## 2017-03-08 DIAGNOSIS — F411 Generalized anxiety disorder: Secondary | ICD-10-CM | POA: Diagnosis not present

## 2017-03-08 DIAGNOSIS — K581 Irritable bowel syndrome with constipation: Secondary | ICD-10-CM

## 2017-03-08 DIAGNOSIS — E559 Vitamin D deficiency, unspecified: Secondary | ICD-10-CM

## 2017-03-08 DIAGNOSIS — N760 Acute vaginitis: Secondary | ICD-10-CM

## 2017-03-08 DIAGNOSIS — R636 Underweight: Secondary | ICD-10-CM

## 2017-03-08 DIAGNOSIS — R5383 Other fatigue: Secondary | ICD-10-CM

## 2017-03-08 DIAGNOSIS — R634 Abnormal weight loss: Secondary | ICD-10-CM | POA: Diagnosis not present

## 2017-03-08 LAB — COMPREHENSIVE METABOLIC PANEL
ALT: 14 U/L (ref 0–35)
AST: 18 U/L (ref 0–37)
Albumin: 4.6 g/dL (ref 3.5–5.2)
Alkaline Phosphatase: 49 U/L (ref 39–117)
BUN: 15 mg/dL (ref 6–23)
CALCIUM: 9.8 mg/dL (ref 8.4–10.5)
CHLORIDE: 101 meq/L (ref 96–112)
CO2: 30 meq/L (ref 19–32)
Creatinine, Ser: 0.92 mg/dL (ref 0.40–1.20)
GFR: 67.42 mL/min (ref >60.00)
Glucose, Bld: 93 mg/dL (ref 70–99)
POTASSIUM: 4.2 meq/L (ref 3.5–5.1)
Sodium: 136 mEq/L (ref 135–145)
Total Bilirubin: 0.5 mg/dL (ref 0.2–1.2)
Total Protein: 7.7 g/dL (ref 6.0–8.3)

## 2017-03-08 LAB — CBC WITH DIFFERENTIAL/PLATELET
BASOS ABS: 0 10*3/uL (ref 0.0–0.1)
Basophils Relative: 0.5 % (ref 0.0–3.0)
Eosinophils Absolute: 0.1 10*3/uL (ref 0.0–0.7)
Eosinophils Relative: 0.9 % (ref 0.0–5.0)
HEMATOCRIT: 41.3 % (ref 36.0–46.0)
Hemoglobin: 14 g/dL (ref 12.0–15.0)
LYMPHS ABS: 1.9 10*3/uL (ref 0.7–4.0)
LYMPHS PCT: 26.8 % (ref 12.0–46.0)
MCHC: 33.7 g/dL (ref 30.0–36.0)
MCV: 93.3 fl (ref 78.0–100.0)
MONOS PCT: 7.2 % (ref 3.0–12.0)
Monocytes Absolute: 0.5 10*3/uL (ref 0.1–1.0)
NEUTROS PCT: 64.6 % (ref 43.0–77.0)
Neutro Abs: 4.5 10*3/uL (ref 1.4–7.7)
Platelets: 231 10*3/uL (ref 150.0–400.0)
RBC: 4.43 Mil/uL (ref 3.87–5.11)
RDW: 12 % (ref 11.5–15.5)
WBC: 7 10*3/uL (ref 4.0–10.5)

## 2017-03-08 LAB — VITAMIN D 25 HYDROXY (VIT D DEFICIENCY, FRACTURES): VITD: 33.9 ng/mL (ref 30.00–100.00)

## 2017-03-08 LAB — VITAMIN B12: VITAMIN B 12: 370 pg/mL (ref 211–911)

## 2017-03-08 LAB — TSH: TSH: 1.17 u[IU]/mL (ref 0.35–4.50)

## 2017-03-08 MED ORDER — ESCITALOPRAM OXALATE 5 MG PO TABS: 2.5000 mg | ORAL_TABLET | Freq: Every day | ORAL | 0 refills | Status: DC

## 2017-03-08 NOTE — Patient Instructions (Addendum)
If you want to gain weight,  I recommend increasing your intake of protein and your healthy fats (think peanut butter, almonds and avocadoes  For a quick high protein breakfast:  [employer] now makes a frozen breakfast frittata that can be microwaved in 2 minutes and is very low carb. Frittats are similar to quiches without the crust   You might want to try a premixed protein drink called Premier Protein shake in the morning.  It is less $$$ and very low sugar.    160 cal  30 g protein  1 g sugar 50% calcium needs    Try adding chia seeds or flax seeds to your smoothies or your cereal for the constipation

## 2017-03-08 NOTE — Progress Notes (Signed)
Patient ID: Kim Woods, female    DOB: 06-17-1960  Age: 56 y.o. MRN: [id number]  The patient is here for annual preventive examination and management of other chronic and acute problems.  Last seen April 2017 for same . PAP smear normal /negative  DEXA April 2018 normal  3d mammogram March 2018 BIRADS 2 Colonoscopy 2016 [place] tubular adenoma   5 yr follow up  Using estrace cream once every 2 weeks   The risk factors are reflected in the social history.  The roster of all physicians providing medical care to patient - is listed in the Snapshot section of the chart.  Activities of daily living:  The patient is 100% independent in all ADLs: dressing, toileting, feeding as well as independent mobility  Home safety : The patient has smoke detectors in the home. They wear seatbelts.  There are no firearms at home. There is no violence in the home.   There is no risks for hepatitis, STDs or HIV. There is no   history of blood transfusion. They have no travel history to infectious disease endemic areas of the world.  The patient has seen their dentist in the last six month. They have seen their eye doctor in the last year. They admit to slight hearing difficulty with regard to whispered voices and some television programs.  They have deferred audiologic testing in the last year.  They do not  have excessive sun exposure. Discussed the need for sun protection: hats, long sleeves and use of sunscreen if there is significant sun exposure.   Diet: the importance of a healthy diet is discussed. They do have a healthy diet.  The benefits of regular aerobic exercise were discussed. She walks 4 times per week ,  20 minutes.   Depression screen: there are no signs or vegative symptoms of depression- irritability, change in appetite, anhedonia, sadness/tearfullness.  Cognitive assessment: the patient manages all their financial and personal affairs and is actively engaged. They could relate day,date,year  and events; recalled 2/3 objects at 3 minutes; performed clock-face test normally.  The following portions of the patient's history were reviewed and updated as appropriate: allergies, current medications, past family history, past medical history,  past surgical history, past social history  and problem list.  Visual acuity was not assessed per patient preference since she has regular follow up with her ophthalmologist. Hearing and body mass index were assessed and reviewed.   During the course of the visit the patient was educated and counseled about appropriate screening and preventive services including : fall prevention , diabetes screening, nutrition counseling, colorectal cancer screening, and recommended immunizations.    CC: The primary encounter diagnosis was Encounter for preventive health examination. Diagnoses of Weight loss, Vitamin D deficiency, Fatigue, unspecified type, Patient underweight, Irritable bowel syndrome with constipation, Vaginitis and vulvovaginitis, and GAD (generalized anxiety disorder) were also pertinent to this visit.   Chronic insomnia . Staying asleep.  Wants to give lexapro another try .  Did not tolerate even a 5 mg dose because it was making her woozy in the morning and unable to drive.   Stressed out about both parents aging; in a retirement community, still in independent living with aides covered by L/T care insurance.  Both have dementia.  Parkinson's and Alzheimers Dementia   Worried about her low weight which is stable .  Diet  And DEXA reviewed: Worried about getting parkinson's and AD.  Parents getting a prescribed food supplement called vayacog  From their  neurologist,  sure what it is .  Maybe fish oil.   Using minimal amount of estrogen vaginal cream due to severe atrophic vaginitis   Bowel pattern is chronic and recurrent: Skips 2 days, then has a day of increased stooling .   has suprapubic pai n on that day that resolves when the constipation  resolves.  Does a yoga/bar/pilates class 2/week    History Kim Woods has a past medical history of Asthma, Breast cancer ([place]), Colon polyp (04-07-15), Hemorrhoids, radiation therapy (08/30/12- 10/04/12), Irritable bowel syndrome (2007), Patient underweight, and Stress fracture of foot.   She has a past surgical history that includes Endometrial ablation (Sept 2011); Sentinel lymph node biopsy (Left, 06/27/2012); Colonoscopy (2006); Breast surgery (2004); Lymph node biopsy (Left, 07/12/2012); Exploratory laparotomy (Sept 2011); Colonoscopy (N/A, 04/07/2015); Breast biopsy (Left, 2006); Breast biopsy (Left, 2014); and Breast lumpectomy (Left, 2014).   Her family history includes Cancer in her mother and paternal grandmother; Mental illness (age of onset: 79) in her father.She reports that  has never smoked. she has never used smokeless tobacco. She reports that she drinks about 4.2 oz of alcohol per week. She reports that she does not use drugs.  Outpatient Medications Prior to Visit  Medication Sig Dispense Refill  . ALPRAZolam (XANAX) 0.25 MG tablet Take 1 tablet (0.25 mg total) by mouth at bedtime as needed for anxiety or sleep. 30 tablet 1  . calcium citrate-vitamin D 200-200 MG-UNIT TABS Take 2 tablets by mouth daily.    [name] ESTRACE VAGINAL 0.1 MG/GM vaginal cream USE 1 APPLICATORFUL VAGINALLY ONCE PER WEEK (Patient taking differently: USE 1 APPLICATORFUL VAGINALLY ONCE PER WEEK pt states that she is only using it twice a month.) 42.5 g 1  . letrozole (FEMARA) 2.5 MG tablet Take 1 tablet (2.5 mg total) by mouth daily. 90 tablet 3   No facility-administered medications prior to visit.     Review of Systems   Patient denies headache, fevers, malaise, unintentional weight loss, skin rash, eye pain, sinus congestion and sinus pain, sore throat, dysphagia,  hemoptysis , cough, dyspnea, wheezing, chest pain, palpitations, orthopnea, edema, abdominal pain, nausea, melena, diarrhea, constipation, flank pain,  dysuria, hematuria, urinary  Frequency, nocturia, numbness, tingling, seizures,  Focal weakness, Loss of consciousness,  Tremor, insomnia, depression, anxiety, and suicidal ideation.     Objective:  BP 108/66 (BP Location: Left Arm, Patient Position: Sitting, Cuff Size: Normal)   Pulse 71   Temp 97.8 F (36.6 C) (Oral)   Resp 15   Ht 5\' 7"  (1.702 m)   Wt 115 lb 6.4 oz (52.3 kg)   LMP 12/25/2009   SpO2 98%   BMI 18.07 kg/m   Physical Exam   General appearance: alert, cooperative and appears stated age Head: Normocephalic, without obvious abnormality, atraumatic Eyes: conjunctivae/corneas clear. PERRL, EOM's intact. Fundi benign. Ears: normal TM's and external ear canals both ears Nose: Nares normal. Septum midline. Mucosa normal. No drainage or sinus tenderness. Throat: lips, mucosa, and tongue normal; teeth and gums normal Neck: no adenopathy, no carotid bruit, no JVD, supple, symmetrical, trachea midline and thyroid not enlarged, symmetric, no tenderness/mass/nodules Lungs: clear to auscultation bilaterally Breasts: normal appearance,  Minimal thickening in left breast noted.  Heart: regular rate and rhythm, S1, S2 normal, no murmur, click, rub or gallop Abdomen: soft, non-tender; bowel sounds normal; no masses,  no organomegaly Extremities: extremities normal, atraumatic, no cyanosis or edema Pulses: 2+ and symmetric Skin: Skin color, texture, turgor normal. No rashes or lesions Neurologic:  Alert and oriented X 3, normal strength and tone. Normal symmetric reflexes. Normal coordination and gait.      Assessment & Plan:   Problem List Items Addressed This Visit    Encounter for preventive health examination - Primary    Annual comprehensive preventive exam was done as well as an evaluation and management of chronic conditions .  During the course of the visit the patient was educated and counseled about appropriate screening and preventive services including :  diabetes  screening, lipid analysis ,  nutrition counseling, colorectal cancer screening, and recommended immunizations.  Printed recommendations for health maintenance screenings was given.        GAD (generalized anxiety disorder)    Aggravated by aging parents,  Daughter's difficulty acclimating to college, and concern about her own health issues.  Discussed resuming a trial of Lexapro at 1/2 tablet daily        Relevant Medications   escitalopram (LEXAPRO) 5 MG tablet   Irritable bowel syndrome    No change to bowel patterns. Reassurance provided.       Patient underweight    Her weight is stable,  Her protein stores are normal and her DEXA is normal.  Reviewed diet and exercise.       Vaginitis and vulvovaginitis    Managed with infrequent use of estrace cream due to failure of other treatments to alleviate her symptoms        Other Visit Diagnoses    Weight loss       Relevant Orders   Comprehensive metabolic panel (Completed)   TSH (Completed)   Vitamin B12 (Completed)   Vitamin D deficiency       Relevant Orders   VITAMIN D 25 Hydroxy (Vit-D Deficiency, Fractures) (Completed)   Fatigue, unspecified type       Relevant Orders   CBC with Differential/Platelet (Completed)      I am having Stephan Minister start on escitalopram. I am also having her maintain her calcium citrate-vitamin D, ALPRAZolam, letrozole, and ESTRACE VAGINAL.  Meds ordered this encounter  Medications  . escitalopram (LEXAPRO) 5 MG tablet    Sig: Take 0.5 tablets (2.5 mg total) daily by mouth.    Dispense:  30 tablet    Refill:  0    There are no discontinued medications.  Follow-up: No Follow-up on file.   Crecencio Mc, MD

## 2017-03-09 ENCOUNTER — Encounter: Payer: Self-pay | Admitting: Internal Medicine

## 2017-03-10 NOTE — Assessment & Plan Note (Signed)
Aggravated by aging parents,  Daughter's difficulty acclimating to college, and concern about her own health issues.  Discussed resuming a trial of Lexapro at 1/2 tablet daily

## 2017-03-10 NOTE — Assessment & Plan Note (Signed)
Managed with infrequent use of estrace cream due to failure of other treatments to alleviate her symptoms

## 2017-03-10 NOTE — Assessment & Plan Note (Signed)
Annual comprehensive preventive exam was done as well as an evaluation and management of chronic conditions .  During the course of the visit the patient was educated and counseled about appropriate screening and preventive services including :  diabetes screening, lipid analysis ,  nutrition counseling, colorectal cancer screening, and recommended immunizations.  Printed recommendations for health maintenance screenings was given.

## 2017-03-10 NOTE — Assessment & Plan Note (Signed)
Her weight is stable,  Her protein stores are normal and her DEXA is normal.  Reviewed diet and exercise.

## 2017-03-10 NOTE — Assessment & Plan Note (Signed)
No change to bowel patterns. Reassurance provided.

## 2017-05-02 DIAGNOSIS — L819 Disorder of pigmentation, unspecified: Secondary | ICD-10-CM | POA: Diagnosis not present

## 2017-05-02 DIAGNOSIS — D225 Melanocytic nevi of trunk: Secondary | ICD-10-CM | POA: Diagnosis not present

## 2017-05-02 DIAGNOSIS — D2261 Melanocytic nevi of right upper limb, including shoulder: Secondary | ICD-10-CM | POA: Diagnosis not present

## 2017-05-02 DIAGNOSIS — D2262 Melanocytic nevi of left upper limb, including shoulder: Secondary | ICD-10-CM | POA: Diagnosis not present

## 2017-05-25 ENCOUNTER — Other Ambulatory Visit: Payer: Self-pay

## 2017-05-25 DIAGNOSIS — C50412 Malignant neoplasm of upper-outer quadrant of left female breast: Secondary | ICD-10-CM

## 2017-05-25 DIAGNOSIS — Z170 Estrogen receptor positive status [ER+]: Principal | ICD-10-CM

## 2017-05-30 ENCOUNTER — Encounter: Payer: Self-pay | Admitting: *Deleted

## 2017-06-30 ENCOUNTER — Ambulatory Visit
Admission: RE | Admit: 2017-06-30 | Discharge: 2017-06-30 | Disposition: A | Discharge status: Home / Self Care | Payer: BLUE CROSS/BLUE SHIELD | Source: Ambulatory Visit | Attending: General Surgery | Admitting: General Surgery

## 2017-06-30 DIAGNOSIS — Z170 Estrogen receptor positive status [ER+]: Secondary | ICD-10-CM | POA: Insufficient documentation

## 2017-06-30 DIAGNOSIS — R922 Inconclusive mammogram: Secondary | ICD-10-CM | POA: Diagnosis not present

## 2017-06-30 DIAGNOSIS — Z1231 Encounter for screening mammogram for malignant neoplasm of breast: Secondary | ICD-10-CM | POA: Insufficient documentation

## 2017-06-30 DIAGNOSIS — C50412 Malignant neoplasm of upper-outer quadrant of left female breast: Secondary | ICD-10-CM | POA: Insufficient documentation

## 2017-06-30 DIAGNOSIS — Z9889 Other specified postprocedural states: Secondary | ICD-10-CM | POA: Insufficient documentation

## 2017-06-30 HISTORY — DX: Personal history of irradiation: Z92.3

## 2017-07-07 ENCOUNTER — Encounter: Payer: Self-pay | Admitting: General Surgery

## 2017-07-07 ENCOUNTER — Ambulatory Visit: Payer: BLUE CROSS/BLUE SHIELD | Admitting: General Surgery

## 2017-07-07 VITALS — BP 110/62 | HR 76 | Resp 11 | Ht 67.0 in | Wt 114.0 lb

## 2017-07-07 DIAGNOSIS — Z853 Personal history of malignant neoplasm of breast: Secondary | ICD-10-CM

## 2017-07-07 NOTE — Progress Notes (Signed)
Patient ID: Kim Woods, female   DOB: 11-21-1960, 57 y.o.   MRN: [id number]  Chief Complaint  Patient presents with  . Follow-up    HPI Kim Woods is a 57 y.o. female.  who presents for her follow up breast cancer and a breast evaluation. The most recent mammogram was done on 06-30-17.  Patient does perform regular self breast checks and gets regular mammograms done.   No new breast issues, tolerating letrozole.  The patient still appreciates vaginal dryness and some dyspareunia.  She is made use of the estradiol tablets vaginally with some improvement, but she finds Estrace much more helpful.  Using about 2 applicators full per month.  We have previously reviewed the counteracting effects of vaginal estrogen such as Estrace and her letrozole.   Past Medical History:  Diagnosis Date  . Asthma    precipitated by seasonal allergies   . 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 BRCA 1-2: No mutation. Treated with wide local excision, sentinel node biopsy and whole breast radiation.  . Colon polyp 04-07-15   TUBULAR ADENOMA  . Hemorrhoids   . Hx of radiation therapy 08/30/12- 10/04/12   left breast 50.4 gy 28 fx, upper outer aspect  boosted 63 gy  . Irritable bowel syndrome 2007   GI eval with colonoscopy [facility]  . Patient underweight   . Personal history of radiation therapy 2014   left breast ca  . Stress fracture of foot     Past Surgical History:  Procedure Laterality Date  . BREAST BIOPSY Left 2006   benign  . BREAST BIOPSY Left 2014   tubular carcinoma  . BREAST LUMPECTOMY Left 2014   tubular carcinoma clear margins LN negative  . BREAST SURGERY  2004   left, normal age 46, incisional bx  . COLONOSCOPY  2006   Dr. Vira Agar  . COLONOSCOPY N/A 04/07/2015   Procedure: COLONOSCOPY;  Surgeon: Manya Silvas, MD;  Location: [facility] ENDOSCOPY;  Service: Endoscopy;  Laterality: N/A;  . ENDOMETRIAL ABLATION   Sept 2011   [facility]  . EXPLORATORY LAPAROTOMY  Sept 2011  . LYMPH NODE BIOPSY Left 07/12/2012  . SENTINEL LYMPH NODE BIOPSY Left 06/27/2012   incisional bx/lumpectomy    Family History  Problem Relation Age of Onset  . Cancer Mother        uterine  . Mental illness Father 67       parkinsons disease  . Cancer Paternal Grandmother        ostesarcoma hip,     Social History Social History   Tobacco Use  . Smoking status: Never Smoker  . Smokeless tobacco: Never Used  Substance Use Topics  . Alcohol use: Yes    Alcohol/week: 4.2 oz    Types: 7 Glasses of wine per week  . Drug use: No    Allergies  Allergen Reactions  . Ciprofloxacin Other (See Comments)    Numbness and tingling  . Nitrofurantoin Monohyd Macro     Numbness, tingling in hands and feet  . Cefuroxime Axetil Rash    Current Outpatient Medications  Medication Sig Dispense Refill  . calcium citrate-vitamin D 200-200 MG-UNIT TABS Take 2 tablets by mouth daily.    [name] ESTRACE VAGINAL 0.1 MG/GM vaginal cream USE 1 APPLICATORFUL VAGINALLY ONCE PER WEEK (Patient taking differently: USE 1 APPLICATORFUL VAGINALLY ONCE PER WEEK pt states that she is only using it twice a month.)  42.5 g 1  . letrozole (FEMARA) 2.5 MG tablet Take 1 tablet (2.5 mg total) by mouth daily. 90 tablet 3  . ALPRAZolam (XANAX) 0.25 MG tablet Take 1 tablet (0.25 mg total) by mouth at bedtime as needed for anxiety or sleep. (Patient not taking: Reported on 07/07/2017) 30 tablet 1   No current facility-administered medications for this visit.     Review of Systems Review of Systems  Constitutional: Negative.   Respiratory: Negative.   Cardiovascular: Negative.   Genitourinary: Vaginal pain: dryness without estrogen use.    Blood pressure 110/62, pulse 76, resp. rate 11, height 5' 7"  (1.702 m), weight 114 lb (51.7 kg), last menstrual period 12/25/2009.  Physical Exam Physical Exam  Constitutional: She is oriented to person, place, and time.  She appears well-developed and well-nourished.  HENT:  Mouth/Throat: Oropharynx is clear and moist.  Eyes: Conjunctivae are normal. No scleral icterus.  Neck: Neck supple.  Cardiovascular: Normal rate, regular rhythm and normal heart sounds.  Pulmonary/Chest: Effort normal and breath sounds normal. Right breast exhibits no inverted nipple, no mass, no nipple discharge, no skin change and no tenderness. Left breast exhibits no inverted nipple, no mass, no nipple discharge, no skin change and no tenderness.    Well healed left breast incision.  Lymphadenopathy:    She has no cervical adenopathy.    She has no axillary adenopathy.       Left: No supraclavicular adenopathy present.  Neurological: She is alert and oriented to person, place, and time.  Skin: Skin is warm and dry.  Psychiatric: Her behavior is normal.    Data Reviewed Bilateral diagnostic mammogram dated June 30, 2017 showed postsurgical changes.  No interval change.  BI-RADS-2.  Assessment    Stable breast exam.  Vaginal dryness.  Now approaching 5 years of antiestrogen therapy.    Plan    Indications for BCI testing discussed.  Patient would be amenable to extended antiestrogen use if she was shown to be at high risk for recurrence. Complete Breast Cancer Index. CA 27.29 The patient has been asked to return to the office in one year with a bilateral diagnostic mammogram.      HPI, Physical Exam, Assessment and Plan have been scribed under the direction and in the presence of Robert Bellow, MD. Karie Fetch, RN   Forest Gleason Zeke Aker 07/08/2017, 7:04 AM

## 2017-07-07 NOTE — Patient Instructions (Signed)
The patient is aware to call back for any questions or concerns.  

## 2017-07-26 ENCOUNTER — Telehealth: Payer: Self-pay | Admitting: General Surgery

## 2017-07-26 NOTE — Telephone Encounter (Signed)
I talked with the patient as well as Dr Bary Castilla. She states she will complete letrozole through June and then stop. Authorization would be needed for testing and deducible would be too high to complete the Breast Cancer Index testing.

## 2017-07-26 NOTE — Telephone Encounter (Signed)
Please call patient regarding the Br Ca index.She is having trouble with her insurance.

## 2017-08-02 ENCOUNTER — Encounter: Payer: Self-pay | Admitting: Internal Medicine

## 2017-08-02 ENCOUNTER — Other Ambulatory Visit: Payer: Self-pay | Admitting: Internal Medicine

## 2017-08-02 ENCOUNTER — Ambulatory Visit: Payer: Self-pay | Admitting: *Deleted

## 2017-08-02 ENCOUNTER — Telehealth: Payer: Self-pay

## 2017-08-02 MED ORDER — DIAZEPAM 5 MG PO TABS: 5.0000 mg | ORAL_TABLET | Freq: Every evening | ORAL | 0 refills | Status: DC | PRN

## 2017-08-02 NOTE — Telephone Encounter (Signed)
Pt returning call.   Agent is making her an appt.  She is requesting medication to help her sleep.   Not a triage call.

## 2017-08-02 NOTE — Telephone Encounter (Signed)
Duplicate message. 

## 2017-08-11 ENCOUNTER — Other Ambulatory Visit: Payer: Self-pay | Admitting: General Surgery

## 2017-08-24 DIAGNOSIS — Z853 Personal history of malignant neoplasm of breast: Secondary | ICD-10-CM | POA: Diagnosis not present

## 2017-08-25 LAB — CANCER ANTIGEN 27.29: CAN 27.29: 8.9 U/mL (ref 0.0–38.6)

## 2017-09-27 ENCOUNTER — Other Ambulatory Visit: Payer: Self-pay | Admitting: Internal Medicine

## 2017-09-30 NOTE — Telephone Encounter (Signed)
Called pt because this medication had been d/c'd per pt request so I wanted to see if the pt was really taking the medication or not. The pt stated that she had stopped previously because she had started having some side effects from it. However she started to feel like she really needed to be taking it again so she had some left over and she took half a pill for the first week then increased to the full pill and has not had any problems with it. Wanted to make sure it was okay to refill or if she needed to com ein for an appt.   Last OV: 03/08/2017 Next OV: not scheduled

## 2017-10-14 ENCOUNTER — Encounter

## 2017-10-17 ENCOUNTER — Other Ambulatory Visit: Payer: Self-pay | Admitting: Internal Medicine

## 2017-10-18 ENCOUNTER — Encounter: Payer: Self-pay | Admitting: Internal Medicine

## 2017-10-19 MED ORDER — ESCITALOPRAM OXALATE 5 MG PO TABS: 5.0000 mg | ORAL_TABLET | Freq: Every day | ORAL | 1 refills | Status: DC

## 2017-10-19 MED ORDER — ESCITALOPRAM OXALATE 5 MG PO TABS: 5.0000 mg | ORAL_TABLET | Freq: Every day | ORAL | 0 refills | Status: DC

## 2017-10-19 NOTE — Addendum Note (Signed)
Addended by: Adair Laundry on: 10/19/2017 04:03 PM   Modules accepted: Orders

## 2017-10-19 NOTE — Telephone Encounter (Signed)
[facility]. Medication has been sent and pt is aware.

## 2017-10-19 NOTE — Telephone Encounter (Signed)
Patient needs a 90 day rx for lexapro to be sent to her [facility],  NOT [facility].  PLEASE CALL HER TO GET THE INFO FOR [facility]

## 2017-10-20 ENCOUNTER — Other Ambulatory Visit: Payer: Self-pay | Admitting: Internal Medicine

## 2017-10-20 DIAGNOSIS — D225 Melanocytic nevi of trunk: Secondary | ICD-10-CM | POA: Diagnosis not present

## 2017-10-20 DIAGNOSIS — D2371 Other benign neoplasm of skin of right lower limb, including hip: Secondary | ICD-10-CM | POA: Diagnosis not present

## 2017-11-01 ENCOUNTER — Telehealth: Payer: Self-pay

## 2017-11-01 NOTE — Telephone Encounter (Signed)
PA for Lexapro has been submitted on covermymeds.

## 2017-11-08 NOTE — Telephone Encounter (Signed)
PA for Lexapro 5mg  has been denied by insurance. Fax states that they were unable to approve for the following reason:   "coverage for additional drug quantities of Lexapro 5mg  can be provided for twice daily dosing or when patient is taking a dose that does not correspond to a commercially available dosage form. Lexapro is available as 5mg , 10mg , and 20mg  tablets. The quantity requested exceeds the plan's limitation of 30 tablets per dispensing, which is sufficient for a 30 day supply of once daily dosing. Coverage will continue with the plan's base limit quantity per dispensing. Additional drug quantities per one dispensing cannot be authorized at this time".

## 2017-11-08 NOTE — Telephone Encounter (Signed)
I don't understand the issue, chart says she is taking  5 mg daily .  If she is taking 5 mg twice daily,  Then tell her she'll have to cut the 10 mg tablet in half   bc insurance wont' pay for 60 5 mg tablets per month

## 2017-11-08 NOTE — Telephone Encounter (Signed)
Spoke with pt and she stated that she is not sure why they pharmacy required a PA on the lexapro because she is only taking the 5mg  once daily. The pt stated that everything has been figured out and she was able to get her medication.

## 2018-02-28 ENCOUNTER — Ambulatory Visit: Payer: BLUE CROSS/BLUE SHIELD | Admitting: Internal Medicine

## 2018-02-28 ENCOUNTER — Encounter: Payer: Self-pay | Admitting: Internal Medicine

## 2018-02-28 DIAGNOSIS — J069 Acute upper respiratory infection, unspecified: Secondary | ICD-10-CM

## 2018-02-28 DIAGNOSIS — B9789 Other viral agents as the cause of diseases classified elsewhere: Secondary | ICD-10-CM | POA: Diagnosis not present

## 2018-02-28 MED ORDER — BENZONATATE 200 MG PO CAPS: 200.0000 mg | ORAL_CAPSULE | Freq: Two times a day (BID) | ORAL | 0 refills | Status: DC | PRN

## 2018-02-28 MED ORDER — PREDNISONE 10 MG PO TABS: ORAL_TABLET | ORAL | 0 refills | Status: DC

## 2018-02-28 NOTE — Patient Instructions (Addendum)
You have a viral syndrome which is causing bronchitis.     post nasal drip may also be contributing to  your  Cough.  I am prescribing a prednisone  Taper  to manage the inflammation in your bronchial tubes   I also advise use of the following OTC meds to help with your other symptoms.   You can use generic OTC benadryl 25 mg  At bedtime for the post  Nasal drip/ drainage,, and use either  tessalon perles or Delsym for daytime cough.   Save the cough syrup with codeine for use in the evening   If you develop fevers or facial pain ,  I will send in an antibiotic

## 2018-02-28 NOTE — Progress Notes (Signed)
Subjective:  Patient ID: Kim Woods, female    DOB: [birth year]  Age: 57 y.o. MRN: [id number]  CC: The encounter diagnosis was Viral URI with cough.  HPI Caressa Scearce presents for evaluaiton and treatment of cough .[name] Started 10 days ago while on a vacation in [place],.Started getting better during the two week vacation.   Flew home and on Sunday at home developed sore throat . Some chest tightness,  Deep breath makes her cough a lot.  No fevers,  Body aches or purulent sputum.     Outpatient Medications Prior to Visit  Medication Sig Dispense Refill  . calcium citrate-vitamin D 200-200 MG-UNIT TABS Take 2 tablets by mouth daily.    [name] escitalopram (LEXAPRO) 5 MG tablet Take 1 tablet (5 mg total) by mouth daily. 90 tablet 1  . ESTRACE VAGINAL 0.1 MG/GM vaginal cream USE 1 APPLICATORFUL VAGINALLY ONCE PER WEEK 42.5 g 1  . ALPRAZolam (XANAX) 0.25 MG tablet Take 1 tablet (0.25 mg total) by mouth at bedtime as needed for anxiety or sleep. (Patient not taking: Reported on 07/07/2017) 30 tablet 1  . diazepam (VALIUM) 5 MG tablet Take 1 tablet (5 mg total) by mouth at bedtime and may repeat dose one time if needed. (Patient not taking: Reported on 02/28/2018) 30 tablet 0  . letrozole (FEMARA) 2.5 MG tablet Take 1 tablet (2.5 mg total) by mouth daily. (Patient not taking: Reported on 02/28/2018) 90 tablet 3   No facility-administered medications prior to visit.     Review of Systems;  Patient denies headache, fevers,  unintentional weight loss, skin rash, eye pain,  sinus pain, sore throat, dysphagia,  hemoptysis , dyspnea, wheezing, chest pain, palpitations, orthopnea, edema, abdominal pain, nausea, melena, diarrhea, constipation, flank pain, dysuria, hematuria, urinary  Frequency, nocturia, numbness, tingling, seizures,  Focal weakness, Loss of consciousness,  Tremor, insomnia, depression, anxiety, and suicidal ideation.      Objective:  BP 108/70 (BP Location: Left Arm, Patient Position: Sitting, Cuff  Size: Normal)   Pulse 77   Temp 98.2 F (36.8 C) (Oral)   Resp 15   Ht 5\' 7"  (1.702 m)   Wt 114 lb 3.2 oz (51.8 kg)   LMP 12/25/2009   SpO2 99%   BMI 17.89 kg/m   BP Readings from Last 3 Encounters:  02/28/18 108/70  07/07/17 110/62  03/08/17 108/66    Wt Readings from Last 3 Encounters:  02/28/18 114 lb 3.2 oz (51.8 kg)  07/07/17 114 lb (51.7 kg)  03/08/17 115 lb 6.4 oz (52.3 kg)    General appearance: alert, cooperative and appears stated age Ears: normal TM's and external ear canals both ears Throat: lips, mucosa, and tongue normal; teeth and gums normal Neck: no adenopathy, no carotid bruit, supple, symmetrical, trachea midline and thyroid not enlarged, symmetric, no tenderness/mass/nodules Back: symmetric, no curvature. ROM normal. No CVA tenderness. Lungs: clear to auscultation bilaterally Heart: regular rate and rhythm, S1, S2 normal, no murmur, click, rub or gallop Abdomen: soft, non-tender; bowel sounds normal; no masses,  no organomegaly Pulses: 2+ and symmetric Skin: Skin color, texture, turgor normal. No rashes or lesions Lymph nodes: Cervical, supraclavicular, and axillary nodes normal.  No results found for: HGBA1C  Lab Results  Component Value Date   CREATININE 0.92 03/08/2017   CREATININE 0.99 08/13/2015   CREATININE 1.16 12/04/2013    Lab Results  Component Value Date   WBC 7.0 03/08/2017   HGB 14.0 03/08/2017   HCT 41.3 03/08/2017   PLT  231.0 03/08/2017   GLUCOSE 93 03/08/2017   CHOL 162 08/13/2015   TRIG 41.0 08/13/2015   HDL 75.10 08/13/2015   LDLCALC 79 08/13/2015   ALT 14 03/08/2017   AST 18 03/08/2017   NA 136 03/08/2017   K 4.2 03/08/2017   CL 101 03/08/2017   CREATININE 0.92 03/08/2017   BUN 15 03/08/2017   CO2 30 03/08/2017   TSH 1.17 03/08/2017    Mm Diag Breast Tomo Bilateral  Result Date: 06/30/2017 CLINICAL DATA:  57 year old patient presents for annual examination. History of tubular carcinoma of the left breast.  Lumpectomy was performed in 2014 followed by radiation therapy. EXAM: DIGITAL DIAGNOSTIC BILATERAL MAMMOGRAM WITH CAD AND TOMO COMPARISON:  Previous exam(s). ACR Breast Density Category d: The breast tissue is extremely dense, which lowers the sensitivity of mammography. FINDINGS: There are stable lumpectomy changes in the upper outer left breast. No mass, nonsurgical distortion, or suspicious microcalcification is identified in either breast to suggest malignancy. Mammographic images were processed with CAD. IMPRESSION: Lumpectomy changes on the left. No evidence of malignancy in either breast. RECOMMENDATION: Diagnostic mammogram is suggested in 1 year. (Code:DM-B-01Y) I have discussed the findings and recommendations with the patient. Results were also provided in writing at the conclusion of the visit. If applicable, a reminder letter will be sent to the patient regarding the next appointment. BI-RADS CATEGORY  2: Benign. Electronically Signed   By: Curlene Dolphin M.D.   On: 06/30/2017 10:17    Assessment & Plan:   Problem List Items Addressed This Visit    Viral URI with cough    URI is most likely viral given the mild HEENT Symptoms  And normal exam.   I have explained that in viral URIS, an antibiotic will not help the symptoms and will increase the risk of developing diarrhea.,  Continue oral and nasal decongestants,prn tylenol 650 mq 8 hrs for aches and pains,  Sinus flushes with Milta Deiters Med's rinse,  prednisone  taper for inflammation, and cough suppressant.   Advised to  Request antibiotic only if symptoms of bacterial sinusitis occur, and advised to take probiotic if the antibiotic is taken,  For a minimum of 3 weeks.          I am having Stephan Minister start on benzonatate. I am also having her maintain her calcium citrate-vitamin D, ALPRAZolam, letrozole, diazepam, ESTRACE VAGINAL, escitalopram, and predniSONE.  Meds ordered this encounter  Medications  . DISCONTD: predniSONE (DELTASONE) 10 MG  tablet    Sig: 6 tablets on Day 1 , then reduce by 1 tablet daily until gone    Dispense:  21 tablet    Refill:  0  . benzonatate (TESSALON) 200 MG capsule    Sig: Take 1 capsule (200 mg total) by mouth 2 (two) times daily as needed for cough.    Dispense:  20 capsule    Refill:  0  . predniSONE (DELTASONE) 10 MG tablet    Sig: 6 tablets on Day 1 , then reduce by 1 tablet daily until gone    Dispense:  21 tablet    Refill:  0    Medications Discontinued During This Encounter  Medication Reason  . predniSONE (DELTASONE) 10 MG tablet Reorder    Follow-up: No follow-ups on file.   Crecencio Mc, MD

## 2018-03-01 DIAGNOSIS — B9789 Other viral agents as the cause of diseases classified elsewhere: Principal | ICD-10-CM

## 2018-03-01 DIAGNOSIS — J069 Acute upper respiratory infection, unspecified: Secondary | ICD-10-CM | POA: Insufficient documentation

## 2018-03-01 NOTE — Assessment & Plan Note (Signed)
URI is most likely viral given the mild HEENT Symptoms  And normal exam.   I have explained that in viral URIS, an antibiotic will not help the symptoms and will increase the risk of developing diarrhea.,  Continue oral and nasal decongestants,prn tylenol 650 mq 8 hrs for aches and pains,  Sinus flushes with Milta Deiters Med's rinse,  prednisone  taper for inflammation, and cough suppressant.   Advised to  Request antibiotic only if symptoms of bacterial sinusitis occur, and advised to take probiotic if the antibiotic is taken,  For a minimum of 3 weeks.

## 2018-03-03 MED ORDER — HYDROCODONE-HOMATROPINE 5-1.5 MG/5ML PO SYRP: 5.0000 mL | ORAL_SOLUTION | Freq: Three times a day (TID) | ORAL | 0 refills | Status: DC | PRN

## 2018-03-13 ENCOUNTER — Ambulatory Visit: Payer: BLUE CROSS/BLUE SHIELD | Admitting: Internal Medicine

## 2018-03-13 ENCOUNTER — Encounter: Payer: Self-pay | Admitting: Internal Medicine

## 2018-03-13 VITALS — BP 114/60 | HR 87 | Temp 98.1°F | Resp 15 | Ht 67.0 in | Wt 113.4 lb

## 2018-03-13 DIAGNOSIS — J069 Acute upper respiratory infection, unspecified: Secondary | ICD-10-CM | POA: Diagnosis not present

## 2018-03-13 DIAGNOSIS — L299 Pruritus, unspecified: Secondary | ICD-10-CM | POA: Diagnosis not present

## 2018-03-13 DIAGNOSIS — R21 Rash and other nonspecific skin eruption: Secondary | ICD-10-CM

## 2018-03-13 DIAGNOSIS — L309 Dermatitis, unspecified: Secondary | ICD-10-CM

## 2018-03-13 DIAGNOSIS — B9789 Other viral agents as the cause of diseases classified elsewhere: Secondary | ICD-10-CM

## 2018-03-13 MED ORDER — PREDNISONE 10 MG PO TABS: ORAL_TABLET | ORAL | 0 refills | Status: DC

## 2018-03-13 MED ORDER — DIAZEPAM 5 MG PO TABS: 5.0000 mg | ORAL_TABLET | Freq: Every evening | ORAL | 0 refills | Status: DC | PRN

## 2018-03-13 NOTE — Patient Instructions (Addendum)
Your rash appears to be an allergic reaction to SOMETHING (??NOT SURE WHAT)   Stop the benadryl except at bedtime you can use 50  mg   You can add a DAYTIME DOSE of one of the newer second generation antihistamines that are longer acting, non sedating and  available OTC:  Generic  Zyrtec, which is cetirizine.  10 mg once or twice daily    generic Allegra , available generically as fexofenadine ;180 mg once or twice daily    Generic Claritin :  also available as loratadine  10 mg Once or twice daily     If symptoms persistent you can start the prednisone  Taper again   I have refilled the valium and sent it to [facility])

## 2018-03-13 NOTE — Progress Notes (Signed)
Subjective:  Patient ID: Kim Woods, female    DOB: [birth year]  Age: 57 y.o. MRN: [id number]  CC: The primary encounter diagnosis was Rash. Diagnoses of Pruritus, Viral URI with cough, and Dermatitis due to unknown cause were also pertinent to this visit.  HPI Kim Woods presents for FOLLOW UP on recurrent cough.  Treated on Nov 5 for  Viral URI that started ten days prior during a trip to visit her daughter in [place]. Symptoms improved,  Then returned after spending time outside,  But since then has resolved.  She finished the prednisone taper but did not like the "change in personality" it caused (irritable)   In the interim she has developed a pruritic area on herleft shoulder posteriorly,  As well as on her abdomen.  The rash is itching is  aggravated by tight clothing and hot baths  . Left eye also became puffy and itchy yesterday   Outpatient Medications Prior to Visit  Medication Sig Dispense Refill  . ALPRAZolam (XANAX) 0.25 MG tablet Take 1 tablet (0.25 mg total) by mouth at bedtime as needed for anxiety or sleep. 30 tablet 1  . benzonatate (TESSALON) 200 MG capsule Take 1 capsule (200 mg total) by mouth 2 (two) times daily as needed for cough. 20 capsule 0  . calcium citrate-vitamin D 200-200 MG-UNIT TABS Take 2 tablets by mouth daily.    [name] escitalopram (LEXAPRO) 5 MG tablet Take 1 tablet (5 mg total) by mouth daily. 90 tablet 1  . ESTRACE VAGINAL 0.1 MG/GM vaginal cream USE 1 APPLICATORFUL VAGINALLY ONCE PER WEEK 42.5 g 1  . letrozole (FEMARA) 2.5 MG tablet Take 1 tablet (2.5 mg total) by mouth daily. 90 tablet 3  . diazepam (VALIUM) 5 MG tablet Take 1 tablet (5 mg total) by mouth at bedtime and may repeat dose one time if needed. 30 tablet 0  . HYDROcodone-homatropine (HYCODAN) 5-1.5 MG/5ML syrup Take 5 mLs by mouth every 8 (eight) hours as needed for cough. (Patient not taking: Reported on 03/13/2018) 120 mL 0  . predniSONE (DELTASONE) 10 MG tablet 6 tablets on Day 1 , then reduce by  1 tablet daily until gone (Patient not taking: Reported on 03/13/2018) 21 tablet 0   No facility-administered medications prior to visit.     Review of Systems;  Patient denies headache, fevers, malaise, unintentional weight loss, skin rash, eye pain, sinus congestion and sinus pain, sore throat, dysphagia,  hemoptysis , cough, dyspnea, wheezing, chest pain, palpitations, orthopnea, edema, abdominal pain, nausea, melena, diarrhea, constipation, flank pain, dysuria, hematuria, urinary  Frequency, nocturia, numbness, tingling, seizures,  Focal weakness, Loss of consciousness,  Tremor, insomnia, depression, anxiety, and suicidal ideation.      Objective:  BP 114/60 (BP Location: Left Arm, Patient Position: Sitting, Cuff Size: Normal)   Pulse 87   Temp 98.1 F (36.7 C) (Oral)   Resp 15   Ht 5\' 7"  (1.702 m)   Wt 113 lb 6.4 oz (51.4 kg)   LMP 12/25/2009   SpO2 98%   BMI 17.76 kg/m   BP Readings from Last 3 Encounters:  03/13/18 114/60  02/28/18 108/70  07/07/17 110/62    Wt Readings from Last 3 Encounters:  03/13/18 113 lb 6.4 oz (51.4 kg)  02/28/18 114 lb 3.2 oz (51.8 kg)  07/07/17 114 lb (51.7 kg)    General appearance: alert, cooperative and appears stated age Ears: normal TM's and external ear canals both ears Throat: lips, mucosa, and tongue normal; teeth and  gums normal Neck: no adenopathy, no carotid bruit, supple, symmetrical, trachea midline and thyroid not enlarged, symmetric, no tenderness/mass/nodules Back: symmetric, no curvature. ROM normal. No CVA tenderness. Lungs: clear to auscultation bilaterally Heart: regular rate and rhythm, S1, S2 normal, no murmur, click, rub or gallop Abdomen: soft, non-tender; bowel sounds normal; no masses,  no organomegaly Pulses: 2+ and symmetric Skin: no hives. Faint erythema to abdominal wall.  Several small disrete papules.  Shoulder with one papule  Lymph nodes: Cervical, supraclavicular, and axillary nodes normal.  No results  found for: HGBA1C  Lab Results  Component Value Date   CREATININE 0.88 03/13/2018   CREATININE 0.92 03/08/2017   CREATININE 0.99 08/13/2015    Lab Results  Component Value Date   WBC 6.1 03/13/2018   HGB 12.9 03/13/2018   HCT 37.4 03/13/2018   PLT 352.0 03/13/2018   GLUCOSE 88 03/13/2018   CHOL 162 08/13/2015   TRIG 41.0 08/13/2015   HDL 75.10 08/13/2015   LDLCALC 79 08/13/2015   ALT 11 03/13/2018   AST 17 03/13/2018   NA 133 (L) 03/13/2018   K 4.1 03/13/2018   CL 99 03/13/2018   CREATININE 0.88 03/13/2018   BUN 14 03/13/2018   CO2 26 03/13/2018   TSH 1.17 03/08/2017    Mm Diag Breast Tomo Bilateral  Result Date: 06/30/2017 CLINICAL DATA:  57 year old patient presents for annual examination. History of tubular carcinoma of the left breast. Lumpectomy was performed in 2014 followed by radiation therapy. EXAM: DIGITAL DIAGNOSTIC BILATERAL MAMMOGRAM WITH CAD AND TOMO COMPARISON:  Previous exam(s). ACR Breast Density Category d: The breast tissue is extremely dense, which lowers the sensitivity of mammography. FINDINGS: There are stable lumpectomy changes in the upper outer left breast. No mass, nonsurgical distortion, or suspicious microcalcification is identified in either breast to suggest malignancy. Mammographic images were processed with CAD. IMPRESSION: Lumpectomy changes on the left. No evidence of malignancy in either breast. RECOMMENDATION: Diagnostic mammogram is suggested in 1 year. (Code:DM-B-01Y) I have discussed the findings and recommendations with the patient. Results were also provided in writing at the conclusion of the visit. If applicable, a reminder letter will be sent to the patient regarding the next appointment. BI-RADS CATEGORY  2: Benign. Electronically Signed   By: Curlene Dolphin M.D.   On: 06/30/2017 10:17    Assessment & Plan:   Problem List Items Addressed This Visit    Dermatitis due to unknown cause    Recommend use of non sedating anthistamine  pending evaluation of liver enzymes and CBC   Lab Results  Component Value Date   WBC 6.1 03/13/2018   HGB 12.9 03/13/2018   HCT 37.4 03/13/2018   MCV 90.6 03/13/2018   PLT 352.0 03/13/2018   Lab Results  Component Value Date   ALT 11 03/13/2018   AST 17 03/13/2018   ALKPHOS 58 03/13/2018   BILITOT 0.4 03/13/2018         Viral URI with cough    Resolved,  Exam is normal today        Other Visit Diagnoses    Rash    -  Primary   Relevant Orders   Sedimentation rate (Completed)   Pruritus       Relevant Orders   Comprehensive metabolic panel (Completed)   CBC with Differential/Platelet (Completed)      I have discontinued Kim Woods's predniSONE and HYDROcodone-homatropine. I am also having her start on predniSONE. Additionally, I am having her maintain her calcium citrate-vitamin  D, ALPRAZolam, letrozole, ESTRACE VAGINAL, escitalopram, benzonatate, and diazepam.  Meds ordered this encounter  Medications  . predniSONE (DELTASONE) 10 MG tablet    Sig: 6 tablets on Day 1 , then reduce by 1 tablet daily until gone    Dispense:  21 tablet    Refill:  0  . diazepam (VALIUM) 5 MG tablet    Sig: Take 1 tablet (5 mg total) by mouth at bedtime and may repeat dose one time if needed.    Dispense:  30 tablet    Refill:  0    Medications Discontinued During This Encounter  Medication Reason  . predniSONE (DELTASONE) 10 MG tablet Error  . HYDROcodone-homatropine (HYCODAN) 5-1.5 MG/5ML syrup Error  . diazepam (VALIUM) 5 MG tablet Reorder    Follow-up: No follow-ups on file.   Crecencio Mc, MD

## 2018-03-14 DIAGNOSIS — L309 Dermatitis, unspecified: Secondary | ICD-10-CM | POA: Insufficient documentation

## 2018-03-14 LAB — CBC WITH DIFFERENTIAL/PLATELET
BASOS ABS: 0 10*3/uL (ref 0.0–0.1)
Basophils Relative: 0.5 % (ref 0.0–3.0)
Eosinophils Absolute: 0.1 10*3/uL (ref 0.0–0.7)
Eosinophils Relative: 2.2 % (ref 0.0–5.0)
HEMATOCRIT: 37.4 % (ref 36.0–46.0)
HEMOGLOBIN: 12.9 g/dL (ref 12.0–15.0)
LYMPHS PCT: 36.9 % (ref 12.0–46.0)
Lymphs Abs: 2.3 10*3/uL (ref 0.7–4.0)
MCHC: 34.5 g/dL (ref 30.0–36.0)
MCV: 90.6 fl (ref 78.0–100.0)
Monocytes Absolute: 0.6 10*3/uL (ref 0.1–1.0)
Monocytes Relative: 10.2 % (ref 3.0–12.0)
NEUTROS PCT: 50.2 % (ref 43.0–77.0)
Neutro Abs: 3.1 10*3/uL (ref 1.4–7.7)
Platelets: 352 10*3/uL (ref 150.0–400.0)
RBC: 4.13 Mil/uL (ref 3.87–5.11)
RDW: 12 % (ref 11.5–15.5)
WBC: 6.1 10*3/uL (ref 4.0–10.5)

## 2018-03-14 LAB — SEDIMENTATION RATE: Sed Rate: 76 mm/hr — ABNORMAL HIGH (ref 0–30)

## 2018-03-14 LAB — COMPREHENSIVE METABOLIC PANEL
ALK PHOS: 58 U/L (ref 39–117)
ALT: 11 U/L (ref 0–35)
AST: 17 U/L (ref 0–37)
Albumin: 4.1 g/dL (ref 3.5–5.2)
BILIRUBIN TOTAL: 0.4 mg/dL (ref 0.2–1.2)
BUN: 14 mg/dL (ref 6–23)
CALCIUM: 9.5 mg/dL (ref 8.4–10.5)
CO2: 26 mEq/L (ref 19–32)
Chloride: 99 mEq/L (ref 96–112)
Creatinine, Ser: 0.88 mg/dL (ref 0.40–1.20)
GFR: 70.7 mL/min (ref >60.00)
Glucose, Bld: 88 mg/dL (ref 70–99)
Potassium: 4.1 mEq/L (ref 3.5–5.1)
Sodium: 133 mEq/L — ABNORMAL LOW (ref 135–145)
TOTAL PROTEIN: 8.1 g/dL (ref 6.0–8.3)

## 2018-03-14 NOTE — Assessment & Plan Note (Signed)
Resolved,  Exam is normal today

## 2018-03-14 NOTE — Assessment & Plan Note (Signed)
Recommend use of non sedating anthistamine pending evaluation of liver enzymes and CBC   Lab Results  Component Value Date   WBC 6.1 03/13/2018   HGB 12.9 03/13/2018   HCT 37.4 03/13/2018   MCV 90.6 03/13/2018   PLT 352.0 03/13/2018   Lab Results  Component Value Date   ALT 11 03/13/2018   AST 17 03/13/2018   ALKPHOS 58 03/13/2018   BILITOT 0.4 03/13/2018

## 2018-04-05 DIAGNOSIS — F411 Generalized anxiety disorder: Secondary | ICD-10-CM | POA: Diagnosis not present

## 2018-04-28 DIAGNOSIS — F411 Generalized anxiety disorder: Secondary | ICD-10-CM | POA: Diagnosis not present

## 2018-05-11 DIAGNOSIS — F411 Generalized anxiety disorder: Secondary | ICD-10-CM | POA: Diagnosis not present

## 2018-05-31 DIAGNOSIS — F411 Generalized anxiety disorder: Secondary | ICD-10-CM | POA: Diagnosis not present

## 2018-06-05 ENCOUNTER — Other Ambulatory Visit: Payer: Self-pay

## 2018-06-05 DIAGNOSIS — Z170 Estrogen receptor positive status [ER+]: Principal | ICD-10-CM

## 2018-06-05 DIAGNOSIS — C50412 Malignant neoplasm of upper-outer quadrant of left female breast: Secondary | ICD-10-CM

## 2018-06-06 ENCOUNTER — Other Ambulatory Visit: Payer: Self-pay

## 2018-06-06 DIAGNOSIS — Z1231 Encounter for screening mammogram for malignant neoplasm of breast: Secondary | ICD-10-CM

## 2018-06-07 DIAGNOSIS — D2271 Melanocytic nevi of right lower limb, including hip: Secondary | ICD-10-CM | POA: Diagnosis not present

## 2018-06-07 DIAGNOSIS — D225 Melanocytic nevi of trunk: Secondary | ICD-10-CM | POA: Diagnosis not present

## 2018-06-07 DIAGNOSIS — D2262 Melanocytic nevi of left upper limb, including shoulder: Secondary | ICD-10-CM | POA: Diagnosis not present

## 2018-06-07 DIAGNOSIS — D2261 Melanocytic nevi of right upper limb, including shoulder: Secondary | ICD-10-CM | POA: Diagnosis not present

## 2018-06-15 DIAGNOSIS — F411 Generalized anxiety disorder: Secondary | ICD-10-CM | POA: Diagnosis not present

## 2018-06-28 ENCOUNTER — Other Ambulatory Visit: Payer: Self-pay | Admitting: Internal Medicine

## 2018-07-03 ENCOUNTER — Ambulatory Visit
Admission: RE | Admit: 2018-07-03 | Discharge: 2018-07-03 | Disposition: A | Discharge status: Home / Self Care | Payer: BLUE CROSS/BLUE SHIELD | Source: Ambulatory Visit | Attending: General Surgery | Admitting: General Surgery

## 2018-07-03 DIAGNOSIS — Z1231 Encounter for screening mammogram for malignant neoplasm of breast: Secondary | ICD-10-CM | POA: Insufficient documentation

## 2018-07-03 DIAGNOSIS — F411 Generalized anxiety disorder: Secondary | ICD-10-CM | POA: Diagnosis not present

## 2018-07-11 ENCOUNTER — Other Ambulatory Visit: Payer: Self-pay

## 2018-07-11 ENCOUNTER — Ambulatory Visit (INDEPENDENT_AMBULATORY_CARE_PROVIDER_SITE_OTHER): Payer: BLUE CROSS/BLUE SHIELD | Admitting: General Surgery

## 2018-07-11 ENCOUNTER — Encounter: Payer: Self-pay | Admitting: General Surgery

## 2018-07-11 VITALS — BP 131/91 | HR 66 | Temp 97.7°F | Ht 67.0 in | Wt 114.0 lb

## 2018-07-11 DIAGNOSIS — Z170 Estrogen receptor positive status [ER+]: Secondary | ICD-10-CM

## 2018-07-11 DIAGNOSIS — C50412 Malignant neoplasm of upper-outer quadrant of left female breast: Secondary | ICD-10-CM

## 2018-07-11 NOTE — Progress Notes (Signed)
Patient ID: Kim Woods, female   DOB: 1961-01-14, 58 y.o.   MRN: [id number]  Chief Complaint  Patient presents with  . Follow-up    mammogram     HPI Kim Woods is a 58 y.o. female who presents for a breast evaluation. The most recent mammogram was done on 07/03/2018. Patient does perform regular self breast checks and gets regular mammograms done. Patient noticed a lump in her right groin area about a month ago. No pain or change in size.   HPI  Past Medical History:  Diagnosis Date  . Asthma    precipitated by seasonal allergies   . Breast cancer ([place]) 06/2012   7 mm tubular carcinoma, grade 1, ER 90%, PR 90%, HER-2/neu not over expressed. BRCA 1-2: No mutation. Treated with wide local excision, sentinel node biopsy and whole breast radiation.  . Colon polyp 04-07-15   TUBULAR ADENOMA  . Hemorrhoids   . Hx of radiation therapy 08/30/12- 10/04/12   left breast 50.4 gy 28 fx, upper outer aspect  boosted 63 gy  . Irritable bowel syndrome 2007   GI eval with colonoscopy [facility]  . Patient underweight   . Personal history of radiation therapy 2014   left breast ca  . Stress fracture of foot     Past Surgical History:  Procedure Laterality Date  . BREAST BIOPSY Left 2006   benign  . BREAST BIOPSY Left 2014   tubular carcinoma  . BREAST LUMPECTOMY Left 2014   tubular carcinoma clear margins LN negative  . BREAST SURGERY  2004   left, normal age 74, incisional bx  . COLONOSCOPY  2006   Dr. Vira Agar  . COLONOSCOPY N/A 04/07/2015   Procedure: COLONOSCOPY;  Surgeon: Manya Silvas, MD;  [facility]COPY;  Service: Endoscopy;  Laterality: N/A;  . ENDOMETRIAL ABLATION  Sept 2011   [facility]  . EXPLORATORY LAPAROTOMY  Sept 2011  . LYMPH NODE BIOPSY Left 07/12/2012  . SENTINEL LYMPH NODE BIOPSY Left 06/27/2012   incisional bx/lumpectomy    Family History  Problem Relation Age of Onset  . Cancer Mother        uterine  . Mental illness Father 27       parkinsons  disease  . Cancer Paternal Grandmother        ostesarcoma hip,     Social History Social History   Tobacco Use  . Smoking status: Never Smoker  . Smokeless tobacco: Never Used  Substance Use Topics  . Alcohol use: Yes    Alcohol/week: 7.0 standard drinks    Types: 7 Glasses of wine per week  . Drug use: No    Allergies  Allergen Reactions  . Ciprofloxacin Other (See Comments)    Numbness and tingling  . Nitrofurantoin Monohyd Macro     Numbness, tingling in hands and feet  . Cefuroxime Axetil Rash    Current Outpatient Medications  Medication Sig Dispense Refill  . calcium citrate-vitamin D 200-200 MG-UNIT TABS Take 2 tablets by mouth daily.    . diazepam (VALIUM) 5 MG tablet Take 1 tablet (5 mg total) by mouth at bedtime and may repeat dose one time if needed. 30 tablet 0  . escitalopram (LEXAPRO) 5 MG tablet TAKE 1 TABLET DAILY 90 tablet 0  . ESTRACE VAGINAL 0.1 MG/GM vaginal cream USE 1 APPLICATORFUL VAGINALLY ONCE PER WEEK 42.5 g 1   No current facility-administered medications for this visit.     Review of Systems Review of Systems  Constitutional: Negative.   Respiratory: Negative.   Cardiovascular: Negative.     Blood pressure (!) 131/91, pulse 66, temperature 97.7 F (36.5 C), temperature source Skin, height 5' 7"  (1.702 m), weight 114 lb (51.7 kg), last menstrual period 12/25/2009, SpO2 100 %.  Physical Exam Physical Exam Exam conducted with a chaperone present.  Constitutional:      Appearance: She is well-developed.  Eyes:     General: No scleral icterus.    Conjunctiva/sclera: Conjunctivae normal.  Neck:     Musculoskeletal: Neck supple.  Cardiovascular:     Rate and Rhythm: Normal rate and regular rhythm.     Pulses:          Femoral pulses are 2+ on the right side and 2+ on the left side.    Heart sounds: Normal heart sounds.  Pulmonary:     Effort: Pulmonary effort is normal.     Breath sounds: Normal breath sounds.  Chest:     Breasts:         Right: No inverted nipple, mass, nipple discharge, skin change or tenderness.        Left: No inverted nipple, mass, nipple discharge, skin change or tenderness.    Lymphadenopathy:     Cervical: No cervical adenopathy.     Upper Body:     Right upper body: No supraclavicular or axillary adenopathy.     Left upper body: No supraclavicular or axillary adenopathy.     Lower Body: No right inguinal adenopathy. No left inguinal adenopathy.  Skin:    General: Skin is warm and dry.       Neurological:     Mental Status: She is alert and oriented to person, place, and time.      Data Reviewed Bilateral screening mammograms dated July 03, 2018 reviewed: Postsurgical change.  BI-RADS-1.  Assessment No evidence of recurrent cancer.  Superficial varix at the proximal thigh accounting for palpable nodule while seated and standing.  Plan  The patient has been asked to return to the office in one year with a bilateral screening  mammogram.The patient is aware to call back for any questions or concerns.  The patient was concerned about the area in the groin and whether this represented lymphadenopathy.  I do not see any evidence of that.  The fact that it was visible while seated would speak against nodal disease and the finding of the prominent varix at this level suggest that this is a simple venous structure rather than a deep-seated process.  The patient has been asked to check this once a month during her routine self-exam and report any change promptly.  HPI, Physical Exam, Assessment and Plan have been scribed under the direction and in the presence of Hervey Ard, MD.  Gaspar Cola, CMA   I have completed the exam and reviewed the above documentation for accuracy and completeness.  I agree with the above.  Haematologist has been used and any errors in dictation or transcription are unintentional.  Hervey Ard, M.D., F.A.C.S.  Forest Gleason Shaneisha Burkel 07/12/2018, 2:31  PM

## 2018-07-11 NOTE — Patient Instructions (Addendum)
The patient has been asked to return to the office in one year with a bilateral screeningmammogram.The patient is aware to call back for any questions or concerns.  

## 2018-09-04 ENCOUNTER — Other Ambulatory Visit: Payer: Self-pay | Admitting: Internal Medicine

## 2018-10-21 IMAGING — MG MM DIGITAL DIAGNOSTIC BILAT W/ TOMO W/ CAD
9 of 14 series · 9 of 30 positions shown · non-contrast
Comparison: Previous exam(s).

CLINICAL DATA: 57-year-old patient presents for annual examination.
History of tubular carcinoma of the left breast. Lumpectomy was
performed in 3392 followed by radiation therapy.

EXAM:
DIGITAL DIAGNOSTIC BILATERAL MAMMOGRAM WITH CAD AND TOMO

[R CC (1 of 2)]
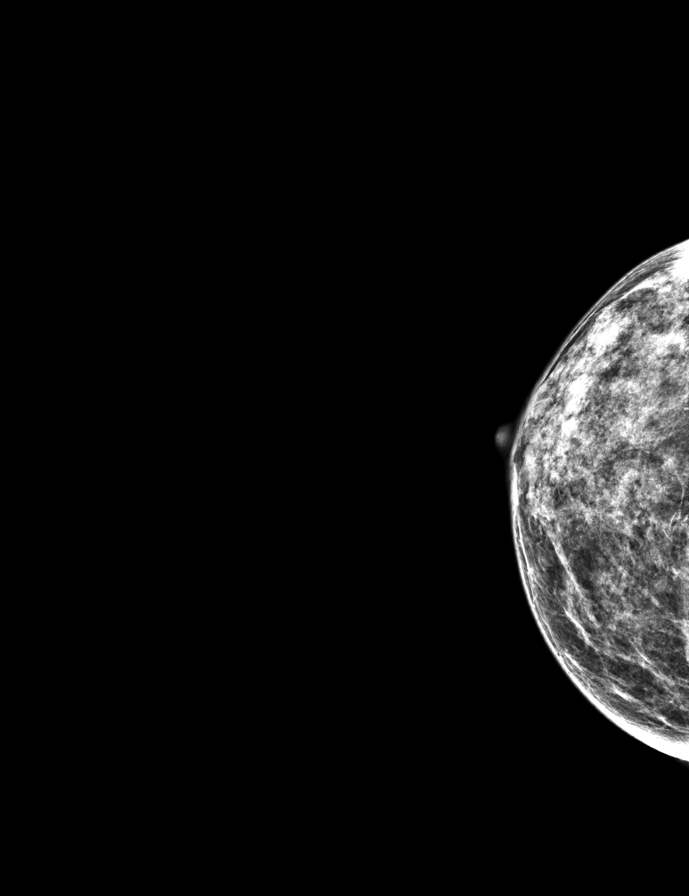

[L MLO (1 of 2)]
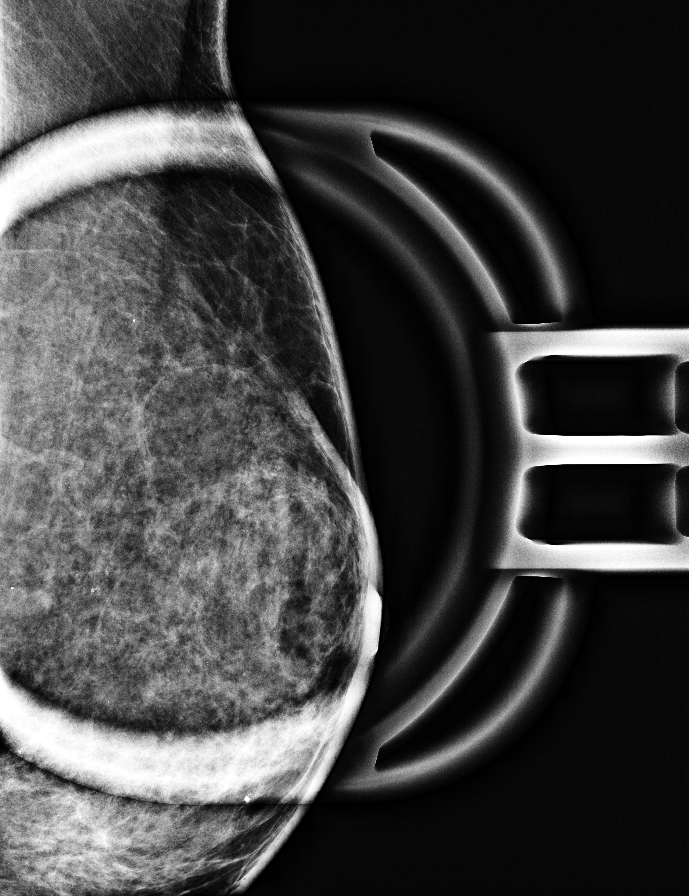

[R MLO synth-2D]
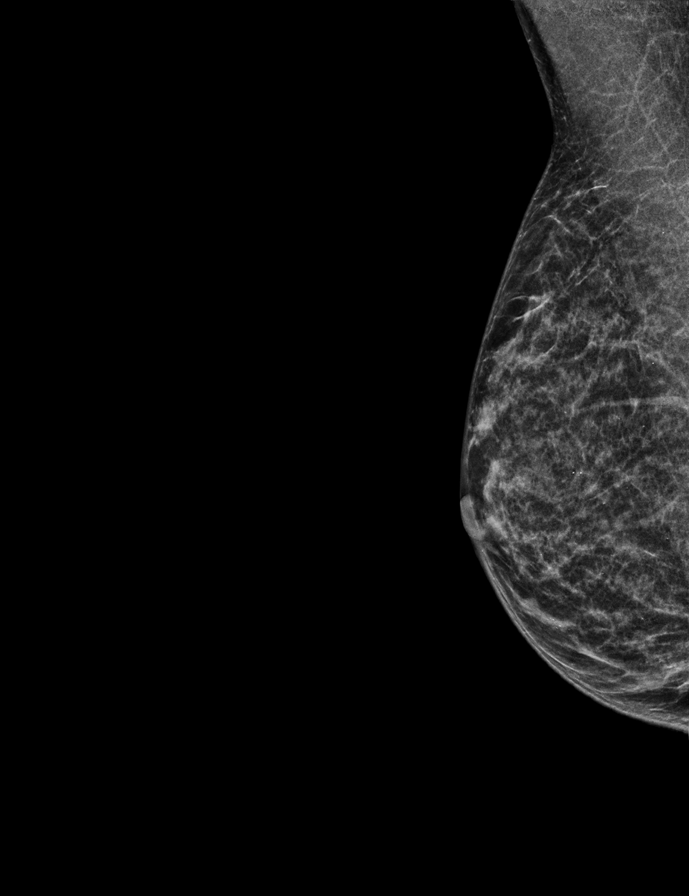

[R CC synth-2D]
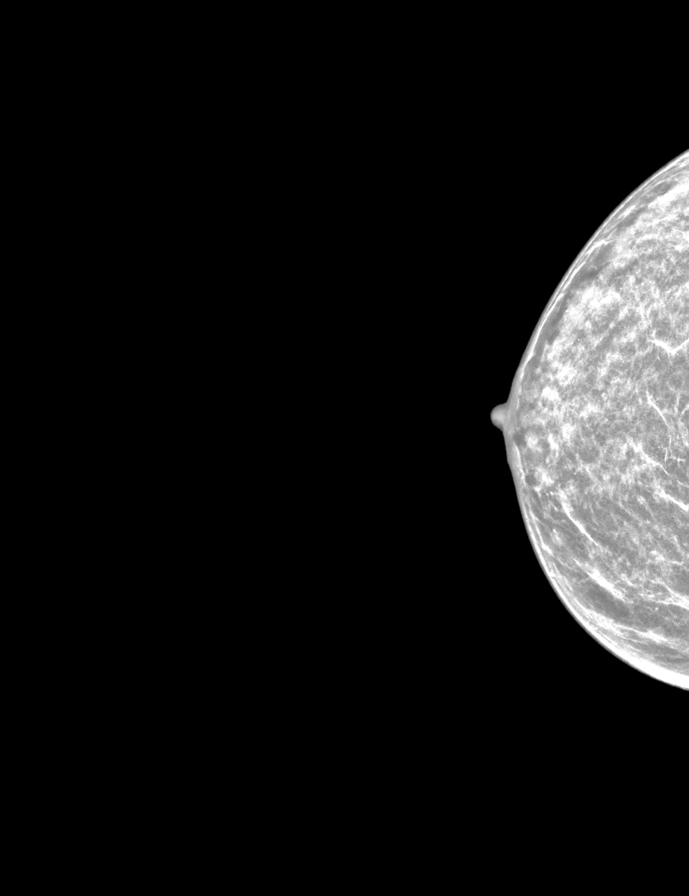

[L MLO synth-2D]
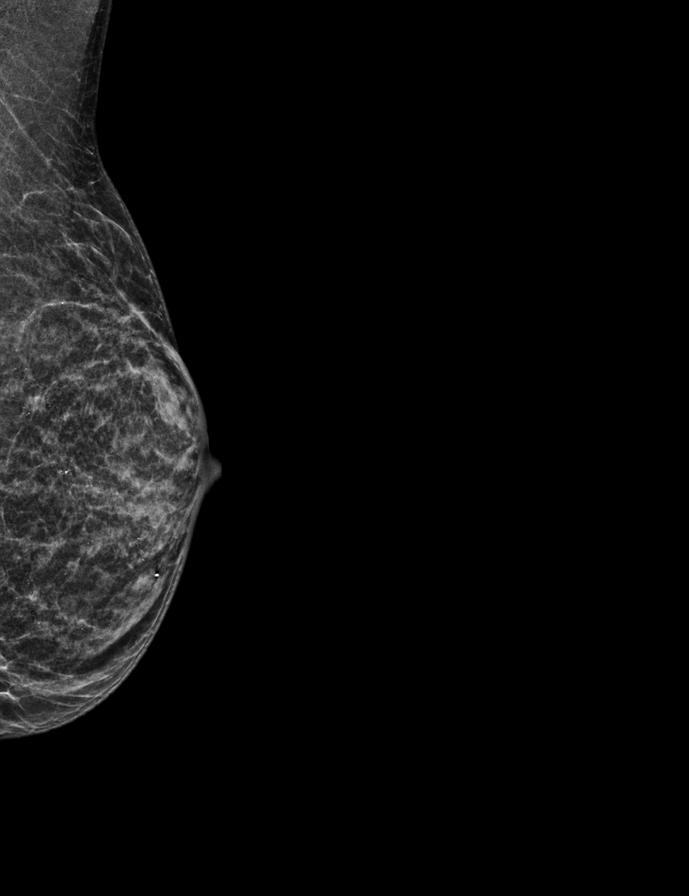

[L MLO (2 of 2)]
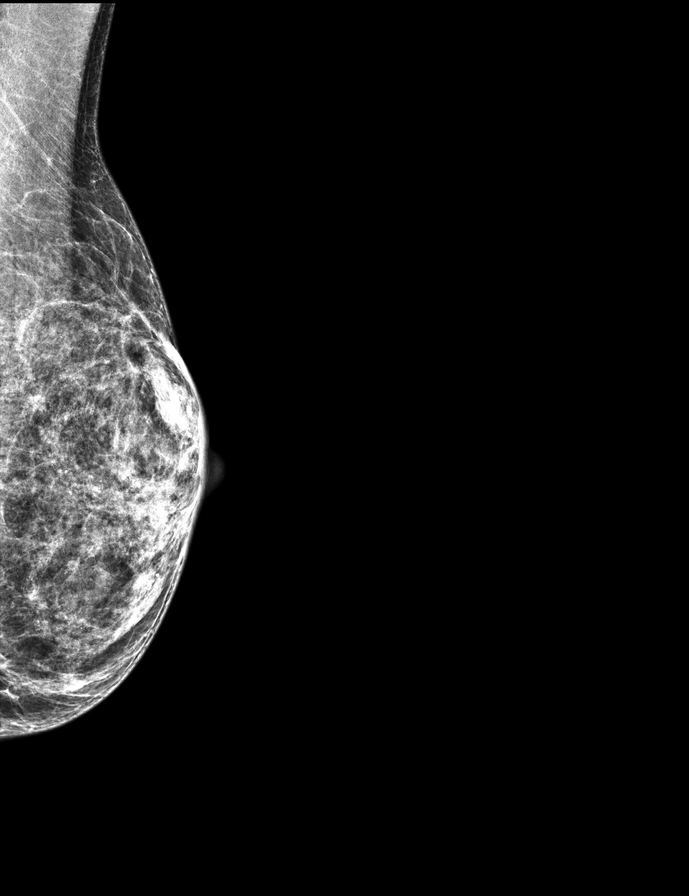

[R CC (2 of 2)]
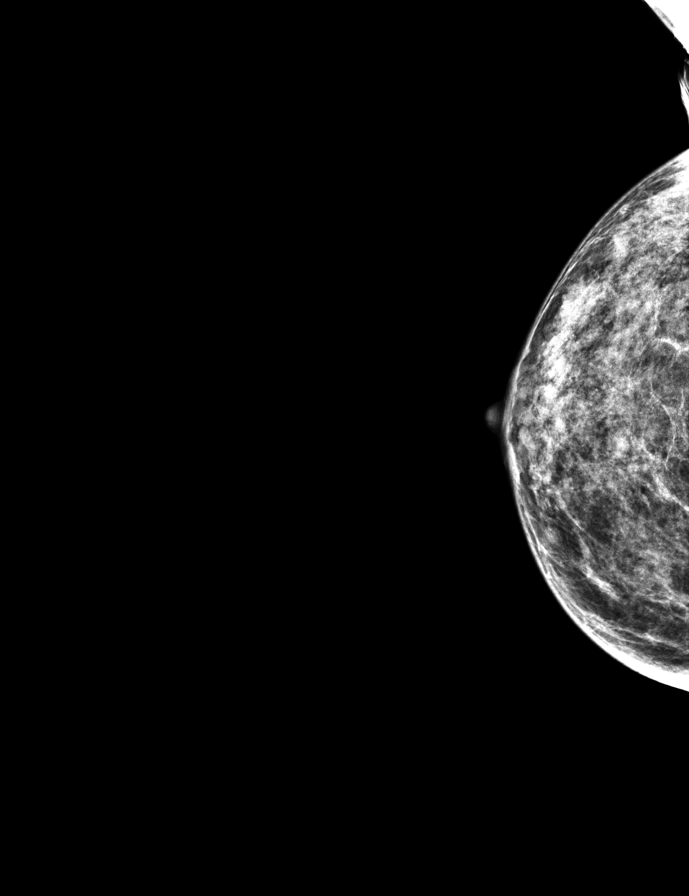

[L CC]
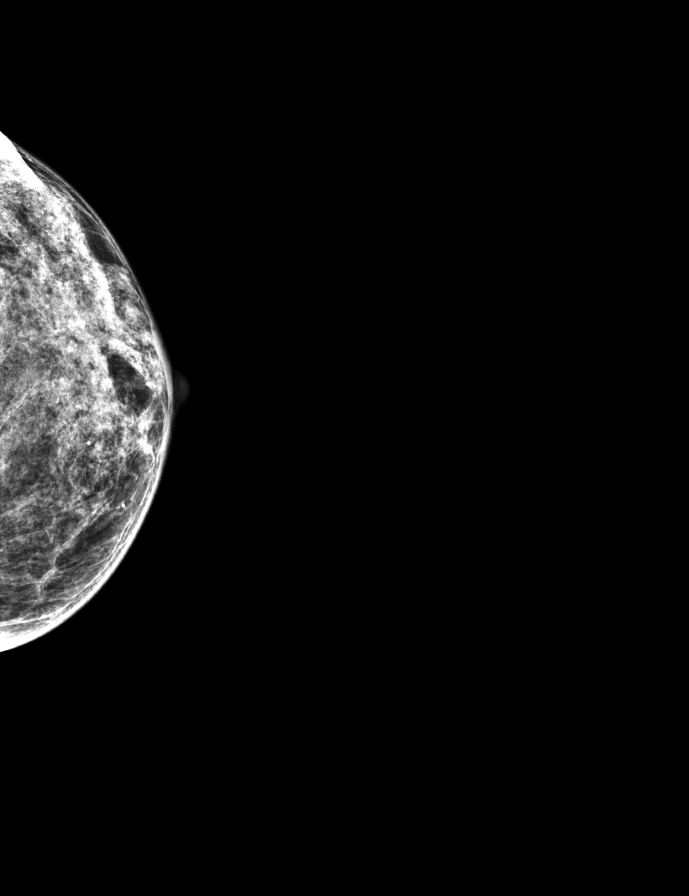

[R MLO]
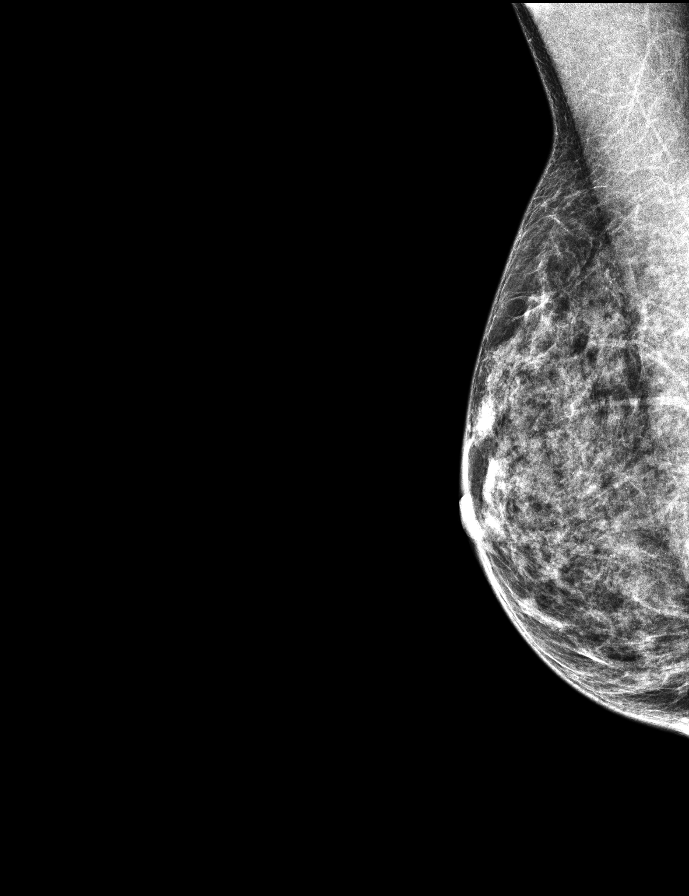

[9 of 30 positions shown; findings below may reference images not displayed]

ACR Breast Density Category d: The breast tissue is extremely dense,
which lowers the sensitivity of mammography.
FINDINGS: There are stable lumpectomy changes in the upper outer left breast.
No mass, nonsurgical distortion, or suspicious microcalcification is
identified in either breast to suggest malignancy.

Mammographic images were processed with CAD.
IMPRESSION: Lumpectomy changes on the left. No evidence of malignancy in either
breast.

RECOMMENDATION:
Diagnostic mammogram is suggested in 1 year. (Code:2R-L-07B)

I have discussed the findings and recommendations with the patient.
Results were also provided in writing at the conclusion of the
visit. If applicable, a reminder letter will be sent to the patient
regarding the next appointment.

BI-RADS CATEGORY  2: Benign.

## 2018-11-01 ENCOUNTER — Encounter: Payer: Self-pay | Admitting: Internal Medicine

## 2018-11-01 ENCOUNTER — Ambulatory Visit (INDEPENDENT_AMBULATORY_CARE_PROVIDER_SITE_OTHER): Payer: BC Managed Care – PPO | Admitting: Internal Medicine

## 2018-11-01 ENCOUNTER — Encounter: Payer: BLUE CROSS/BLUE SHIELD | Admitting: Internal Medicine

## 2018-11-01 ENCOUNTER — Other Ambulatory Visit: Payer: Self-pay

## 2018-11-01 DIAGNOSIS — G4700 Insomnia, unspecified: Secondary | ICD-10-CM

## 2018-11-01 DIAGNOSIS — K581 Irritable bowel syndrome with constipation: Secondary | ICD-10-CM | POA: Diagnosis not present

## 2018-11-01 MED ORDER — DICYCLOMINE HCL 10 MG PO CAPS: 10.0000 mg | ORAL_CAPSULE | Freq: Three times a day (TID) | ORAL | 3 refills | Status: DC

## 2018-11-01 MED ORDER — TRAZODONE HCL 50 MG PO TABS: 25.0000 mg | ORAL_TABLET | Freq: Every evening | ORAL | 3 refills | Status: AC | PRN

## 2018-11-01 NOTE — Progress Notes (Signed)
Virtual Visit via [email or web address]   This visit type was conducted due to national recommendations for restrictions regarding the COVID-19 pandemic (e.g. social distancing).  This format is felt to be most appropriate for this patient at this time.  All issues noted in this document were discussed and addressed.  No physical exam was performed (except for noted visual exam findings with Video Visits).   I connected with@ on 11/01/18 at 10:30 AM EDT by a video enabled telemedicine application and verified that I am speaking with the correct person using two identifiers. Location patient: home Location provider: work or home office Persons participating in the virtual visit: patient, provider  I discussed the limitations, risks, security and privacy concerns of performing an evaluation and management service by telephone and the availability of in person appointments. I also discussed with the patient that there may be a patient responsible charge related to this service. The patient expressed understanding and agreed to proceed.  Reason for visit: lower abdominal pain , constipation, back pain , insomnia   HPI:  58 yr old female with history of breast CA, IBS,  presents with a 2 week history of constipation accompanied by left upper quadrant discomfort mostly post prandially  That is episodic and described as a "stitch" . She denies blood in stool ,  Rectal pain,  But notes that her stools are small caliber, formed and more frequent than usual.   2) Insomnia : worse due to stress of working from home during the epidemic .  Has tried natural remedies  Alprazolam trial historically gave her a headache   3) The patient has no signs or symptoms of COVID 19 infection (fever, cough, sore throat  or shortness of breath beyond what is typical for patient).  Patient denies contact with other persons with the above mentioned symptoms or with anyone confirmed to have COVID 19    ROS: See pertinent positives and  negatives per HPI.  Past Medical History:  Diagnosis Date  . Asthma    precipitated by seasonal allergies   . 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 BRCA 1-2: No mutation. Treated with wide local excision, sentinel node biopsy and whole breast radiation.  . Colon polyp 04-07-15   TUBULAR ADENOMA  . Hemorrhoids   . Hx of radiation therapy 08/30/12- 10/04/12   left breast 50.4 gy 28 fx, upper outer aspect  boosted 63 gy  . Irritable bowel syndrome 2007   GI eval with colonoscopy [facility]  . Patient underweight   . Personal history of radiation therapy 2014   left breast ca  . Stress fracture of foot     Past Surgical History:  Procedure Laterality Date  . BREAST BIOPSY Left 2006   benign  . BREAST BIOPSY Left 2014   tubular carcinoma  . BREAST LUMPECTOMY Left 2014   tubular carcinoma clear margins LN negative  . BREAST SURGERY  2004   left, normal age 35, incisional bx  . COLONOSCOPY  2006   Dr. Vira Agar  . COLONOSCOPY N/A 04/07/2015   Procedure: COLONOSCOPY;  Surgeon: Manya Silvas, MD;  [facility]: [facility] END[facility]COPY;  Service: Endoscopy;  Laterality: N/A;  . ENDOMETRIAL ABLATION  Sept 2011   Novasure  . EXPLORATORY LAPAROTOMY  Sept 2011  . LYMPH NODE BIOPSY Left 07/12/2012  . SENTINEL LYMPH NODE BIOPSY Left 06/27/2012   incisional bx/lumpectomy    Family History  Problem Relation  Age of Onset  . Cancer Mother        uterine  . Mental illness Father 73       parkinsons disease  . Cancer Paternal Grandmother        ostesarcoma hip,     SOCIAL HX:  reports that she has never smoked. She has never used smokeless tobacco. She reports current alcohol use of about 7.0 standard drinks of alcohol per week. She reports that she does not use drugs.  Current Outpatient Medications:  .  calcium citrate-vitamin D 200-200 MG-UNIT TABS, Take 2 tablets by mouth daily., Disp: , Rfl:  .  diazepam (VALIUM) 5 MG  tablet, Take 1 tablet (5 mg total) by mouth at bedtime and may repeat dose one time if needed., Disp: 30 tablet, Rfl: 0 .  escitalopram (LEXAPRO) 5 MG tablet, TAKE 1 TABLET DAILY, Disp: 90 tablet, Rfl: 3 .  ESTRACE VAGINAL 0.1 MG/GM vaginal cream, USE 1 APPLICATORFUL VAGINALLY ONCE PER WEEK, Disp: 42.5 g, Rfl: 1 .  dicyclomine (BENTYL) 10 MG capsule, Take 1 capsule (10 mg total) by mouth 4 (four) times daily -  before meals and at bedtime., Disp: 30 capsule, Rfl: 3 .  traZODone (DESYREL) 50 MG tablet, Take 0.5-1 tablets (25-50 mg total) by mouth at bedtime as needed for sleep., Disp: 30 tablet, Rfl: 3  EXAM:  VITALS per patient if applicable:  GENERAL: alert, oriented, appears well and in no acute distress  HEENT: atraumatic, conjunttiva clear, no obvious abnormalities on inspection of external nose and ears  NECK: normal movements of the head and neck  LUNGS: on inspection no signs of respiratory distress, breathing rate appears normal, no obvious gross SOB, gasping or wheezing  CV: no obvious cyanosis  MS: moves all visible extremities without noticeable abnormality  PSYCH/NEURO: pleasant and cooperative, no obvious depression or anxiety, speech and thought processing grossly intact  ASSESSMENT AND PLAN:  Irritable bowel syndrome Current symptoms suggestive of IBS flare.  Trial of dicyclomine.  If no improvement,  CT scan of ab and pelvis to rule out diverticulitis  Insomnia Chronic, with no improvement using over-the-counter first generation antihistamines. Reviewed principles of good sleep hygiene. Although she is a snorer, there is no report of apneic spells by husband.Trial of trazodone discussed     I discussed the assessment and treatment plan with the patient. The patient was provided an opportunity to ask questions and all were answered. The patient agreed with the plan and demonstrated an understanding of the instructions.   The patient was advised to call back or seek  an in-person evaluation if the symptoms worsen or if the condition fails to improve as anticipated.  I provided 25 minutes of non-face-to-face time during this encounter.   Teresa L Tullo, MD  

## 2018-11-01 NOTE — Patient Instructions (Signed)
Your symptoms suggest IBS as the cause for your cramping and pain.  Try using the dicyclomine 30  minutes prior to big meals.   Trazodone for sleep:  1/2 tablet 30 minutes to an hour before bedtime .[name]  Dose can be increased up to 100 nightly if needed

## 2018-11-03 DIAGNOSIS — G4700 Insomnia, unspecified: Secondary | ICD-10-CM | POA: Insufficient documentation

## 2018-11-03 NOTE — Assessment & Plan Note (Signed)
Current symptoms suggestive of IBS flare.  Trial of dicyclomine.  If no improvement,  CT scan of ab and pelvis to rule out diverticulitis

## 2018-11-03 NOTE — Assessment & Plan Note (Signed)
Chronic, with no improvement using over-the-counter first generation antihistamines. Reviewed principles of good sleep hygiene. Although she is a snorer, there is no report of apneic spells by husband.Trial of trazodone discussed

## 2018-11-23 ENCOUNTER — Encounter: Payer: Self-pay | Admitting: General Surgery

## 2018-11-29 ENCOUNTER — Encounter: Payer: BLUE CROSS/BLUE SHIELD | Admitting: Internal Medicine

## 2018-12-06 ENCOUNTER — Telehealth: Payer: Self-pay | Admitting: General Practice

## 2018-12-06 NOTE — Telephone Encounter (Signed)
Patient said she was going to discuss her appointments with her primary care doctor.

## 2018-12-13 ENCOUNTER — Ambulatory Visit: Payer: BLUE CROSS/BLUE SHIELD | Admitting: Internal Medicine

## 2018-12-22 ENCOUNTER — Other Ambulatory Visit: Payer: Self-pay

## 2018-12-22 ENCOUNTER — Encounter: Payer: Self-pay | Admitting: Internal Medicine

## 2018-12-22 ENCOUNTER — Other Ambulatory Visit (HOSPITAL_COMMUNITY)
Admission: RE | Admit: 2018-12-22 | Discharge: 2018-12-22 | Disposition: A | Discharge status: Home / Self Care | Payer: BC Managed Care – PPO | Source: Ambulatory Visit | Attending: Internal Medicine | Admitting: Internal Medicine

## 2018-12-22 ENCOUNTER — Ambulatory Visit (INDEPENDENT_AMBULATORY_CARE_PROVIDER_SITE_OTHER): Payer: BC Managed Care – PPO | Admitting: Internal Medicine

## 2018-12-22 VITALS — BP 110/68 | HR 80 | Temp 97.8°F | Resp 14 | Ht 67.0 in | Wt 116.8 lb

## 2018-12-22 DIAGNOSIS — R636 Underweight: Secondary | ICD-10-CM

## 2018-12-22 DIAGNOSIS — G4700 Insomnia, unspecified: Secondary | ICD-10-CM | POA: Diagnosis not present

## 2018-12-22 DIAGNOSIS — Z853 Personal history of malignant neoplasm of breast: Secondary | ICD-10-CM

## 2018-12-22 DIAGNOSIS — Z124 Encounter for screening for malignant neoplasm of cervix: Secondary | ICD-10-CM

## 2018-12-22 DIAGNOSIS — Z0000 Encounter for general adult medical examination without abnormal findings: Secondary | ICD-10-CM

## 2018-12-22 DIAGNOSIS — N952 Postmenopausal atrophic vaginitis: Secondary | ICD-10-CM | POA: Diagnosis not present

## 2018-12-22 MED ORDER — DIAZEPAM 5 MG PO TABS: 5.0000 mg | ORAL_TABLET | Freq: Every evening | ORAL | 0 refills | Status: DC | PRN

## 2018-12-22 NOTE — Progress Notes (Signed)
Patient ID: Kim Woods, female    DOB: May 08, 1960  Age: 58 y.o. MRN: [id number]  The patient is here for annual preventive  examination and management of other chronic and acute problems.   PAP DUE MAMMOGRAM MARCH 2020  BY BYRNETT   The risk factors are reflected in the social history.  The roster of all physicians providing medical care to patient - is listed in the Snapshot section of the chart.  Activities of daily living:  The patient is 100% independent in all ADLs: dressing, toileting, feeding as well as independent mobility  Home safety : The patient has smoke detectors in the home. They wear seatbelts.  There are no firearms at home. There is no violence in the home.   There is no risks for hepatitis, STDs or HIV. There is no   history of blood transfusion. They have no travel history to infectious disease endemic areas of the world.  The patient has seen their dentist in the last six month. They have seen their eye doctor in the last year.   They do not  have excessive sun exposure. Discussed the need for sun protection: hats, long sleeves and use of sunscreen if there is significant sun exposure.   Diet: the importance of a healthy diet is discussed. They do have a healthy diet.  The benefits of regular aerobic exercise were discussed. She walks 4 times per week ,  20 minutes.   Depression screen: there are   vegative symptoms of reactive depression- irritability, change in appetite, anhedonia, sadness/tearfullness due to being laid off from her job 2 days ago.   The following portions of the patient's history were reviewed and updated as appropriate: allergies, current medications, past family history, past medical history,  past surgical history, past social history  and problem list.  Visual acuity was not assessed per patient preference since she has regular follow up with her ophthalmologist. Hearing and body mass index were assessed and reviewed.   During the course of the  visit the patient was educated and counseled about appropriate screening and preventive services including : fall prevention , diabetes screening, nutrition counseling, colorectal cancer screening, and recommended immunizations.    CC: The primary encounter diagnosis was Cervical cancer screening. Diagnoses of Patient underweight, Insomnia, unspecified type, Post-menopausal atrophic vaginitis, Encounter for preventive health examination, and History of breast cancer were also pertinent to this visit.  TEARFUL .  LOST JOB Wednesday,  STRESSED OUT.   INSOMNIA : USING TRAZODONE 1/2 TABLET MAKES her feel weird.  Wants to use valium  Short term . Also caring for mother more stressful due to [place] . DISCUSSED increasing lexapro to 10 mg daily  Atrophic vaginitis;  Using estrogen cream just once a month due to history of breast cancer.   Intercourse is painful   Follow up on breast cancer with byrnett now done annually  History Adelynne has a past medical history of Asthma, Breast cancer ([place]) (06/2012), Colon polyp (04-07-15), Hemorrhoids, radiation therapy (08/30/12- 10/04/12), Irritable bowel syndrome (2007), Patient underweight, Personal history of radiation therapy (2014), and Stress fracture of foot.   She has a past surgical history that includes Endometrial ablation (Sept 2011); Sentinel lymph node biopsy (Left, 06/27/2012); Colonoscopy (2006); Breast surgery (2004); Lymph node biopsy (Left, 07/12/2012); Exploratory laparotomy (Sept 2011); Colonoscopy (N/A, 04/07/2015); Breast biopsy (Left, 2006); Breast biopsy (Left, 2014); and Breast lumpectomy (Left, 2014).   Her family history includes Cancer in her mother and paternal grandmother; Mental illness (age  of onset: 17) in her father.She reports that she has never smoked. She has never used smokeless tobacco. She reports current alcohol use of about 7.0 standard drinks of alcohol per week. She reports that she does not use drugs.  Outpatient Medications  Prior to Visit  Medication Sig Dispense Refill  . calcium citrate-vitamin D 200-200 MG-UNIT TABS Take 2 tablets by mouth daily.    [name] dicyclomine (BENTYL) 10 MG capsule Take 1 capsule (10 mg total) by mouth 4 (four) times daily -  before meals and at bedtime. 30 capsule 3  . escitalopram (LEXAPRO) 5 MG tablet TAKE 1 TABLET DAILY 90 tablet 3  . ESTRACE VAGINAL 0.1 MG/GM vaginal cream USE 1 APPLICATORFUL VAGINALLY ONCE PER WEEK 42.5 g 1  . traZODone (DESYREL) 50 MG tablet Take 0.5-1 tablets (25-50 mg total) by mouth at bedtime as needed for sleep. 30 tablet 3  . diazepam (VALIUM) 5 MG tablet Take 1 tablet (5 mg total) by mouth at bedtime and may repeat dose one time if needed. 30 tablet 0   No facility-administered medications prior to visit.     Review of Systems   Patient denies headache, fevers, malaise, unintentional weight loss, skin rash, eye pain, sinus congestion and sinus pain, sore throat, dysphagia,  hemoptysis , cough, dyspnea, wheezing, chest pain, palpitations, orthopnea, edema, abdominal pain, nausea, melena, diarrhea, constipation, flank pain, dysuria, hematuria, urinary  Frequency, nocturia, numbness, tingling, seizures,  Focal weakness, Loss of consciousness,  Tremor, insomnia, depression, anxiety, and suicidal ideation.      Objective:  BP 110/68 (BP Location: Left Arm, Patient Position: Sitting, Cuff Size: Normal)   Pulse 80   Temp 97.8 F (36.6 C) (Oral)   Resp 14   Ht _0  (1.702 m)   Wt 116 lb 12.8 oz (53 kg)   LMP 12/25/2009   SpO2 97%   BMI 18.29 kg/m   Physical Exam  General Appearance:    Alert, cooperative, no distress, appears stated age  Head:    Normocephalic, without obvious abnormality, atraumatic  Eyes:    PERRL, conjunctiva/corneas clear, EOM's intact, fundi    benign, both eyes  Ears:    Normal TM's and external ear canals, both ears  Nose:   Nares normal, septum midline, mucosa normal, no drainage    or sinus tenderness  Throat:   Lips,  mucosa, and tongue normal; teeth and gums normal  Neck:   Supple, symmetrical, trachea midline, no adenopathy;    thyroid:  no enlargement/tenderness/nodules; no carotid   bruit or JVD  Back:     Symmetric, no curvature, ROM normal, no CVA tenderness  Lungs:     Clear to auscultation bilaterally, respirations unlabored  Chest Wall:    No tenderness or deformity   Heart:    Regular rate and rhythm, S1 and S2 normal, no murmur, rub   or gallop  Breast Exam:    No tenderness, masses, or nipple abnormality.  Well healed suergical scar left breast  Abdomen:     Soft, non-tender, bowel sounds active all four quadrants,    no masses, no organomegaly  Genitalia:    Pelvic: cervix normal in appearance, external genitalia normal, no adnexal masses or tenderness, no cervical motion tenderness, rectovaginal septum normal, uterus normal size, shape, and consistency and vagina normal without discharge  Extremities:   Extremities normal, atraumatic, no cyanosis or edema  Pulses:   2+ and symmetric all extremities  Skin:   Skin color, texture, turgor normal, no  rashes or lesions  Lymph nodes:   Cervical, supraclavicular, and axillary nodes normal  Neurologic:   CNII-XII intact, normal strength, sensation and reflexes    throughout     Assessment & Plan:   Problem List Items Addressed This Visit      Unprioritized   Patient underweight    Her weight is  Up 2 lbs and BMI now > 18,  Her protein stores are normal and her DEXA is normal.  Reviewed diet and exercise.       Encounter for preventive health examination    age appropriate education and counseling updated, referrals for preventative services and immunizations addressed, dietary and smoking counseling addressed, most recent labs reviewed.  I have personally reviewed and have noted:  1) the patient's medical and social history 2) The pt's use of alcohol, tobacco, and illicit drugs 3) The patient's current medications and supplements 4)  Functional ability including ADL's, fall risk, home safety risk, hearing and visual impairment 5) Diet and physical activities 6) Evidence for depression or mood disorder 7) The patient's height, weight, and BMI have been recorded in the chart  I have made referrals, and provided counseling and education based on review of the above      History of breast cancer    Continue annual mammogram screening with Merry Proud byrnett       Insomnia    Intolerant of low dose of trazodone.  Approved request for valium short term .  The risks and benefits of  Chronic  benzodiazepine use were discussed with patient today including increased risk of dementia,  Addiction, and seizures if abruptly withdrawn  . [name]       Post-menopausal atrophic vaginitis    She has been avoiding therapeutic doses of estradiol vaginal cream due to history of brca. She cannot participate in intercourse with husband due to dryness and atrophy,  Encouraged to increase estrogen use to once a week (currently using 0.5 grams once a month)       Other Visit Diagnoses    Cervical cancer screening    -  Primary   Relevant Orders   Cytology - PAP( [place])      I am having Stephan Minister maintain her calcium citrate-vitamin D, ESTRACE VAGINAL, escitalopram, dicyclomine, traZODone, and diazepam.  Meds ordered this encounter  Medications  . diazepam (VALIUM) 5 MG tablet    Sig: Take 1 tablet (5 mg total) by mouth at bedtime and may repeat dose one time if needed.    Dispense:  30 tablet    Refill:  0    Medications Discontinued During This Encounter  Medication Reason  . diazepam (VALIUM) 5 MG tablet Reorder    Follow-up: Return in about 4 weeks (around 01/19/2019) for follow up on anxdity .   Crecencio Mc, MD

## 2018-12-22 NOTE — Patient Instructions (Addendum)
I AGREE WITH INCREASING YOUR LEXAPRO DOSE TO 10 MG DAILY    Let's have a One month follow up on anxiety   You can use the estradiol cream one a week mixed with olive oil

## 2018-12-24 DIAGNOSIS — N952 Postmenopausal atrophic vaginitis: Secondary | ICD-10-CM | POA: Insufficient documentation

## 2018-12-24 NOTE — Assessment & Plan Note (Signed)
She has been avoiding therapeutic doses of estradiol vaginal cream due to history of brca. She cannot participate in intercourse with husband due to dryness and atrophy,  Encouraged to increase estrogen use to once a week (currently using 0.5 grams once a month)

## 2018-12-24 NOTE — Assessment & Plan Note (Signed)

## 2018-12-24 NOTE — Assessment & Plan Note (Addendum)
Her weight is  Up 2 lbs and BMI now > 18,  Her protein stores are normal and her DEXA is normal.  Reviewed diet and exercise.

## 2018-12-24 NOTE — Assessment & Plan Note (Signed)
Intolerant of low dose of trazodone.  Approved request for valium short term .  The risks and benefits of  Chronic  benzodiazepine use were discussed with patient today including increased risk of dementia,  Addiction, and seizures if abruptly withdrawn  . [name]

## 2018-12-24 NOTE — Assessment & Plan Note (Signed)
Continue annual mammogram screening with Merry Proud byrnett

## 2018-12-27 LAB — CYTOLOGY - PAP
Diagnosis: NEGATIVE
HPV: NOT DETECTED

## 2019-01-05 ENCOUNTER — Telehealth: Payer: Self-pay | Admitting: Internal Medicine

## 2019-01-05 ENCOUNTER — Other Ambulatory Visit: Payer: Self-pay | Admitting: Internal Medicine

## 2019-01-05 DIAGNOSIS — F411 Generalized anxiety disorder: Secondary | ICD-10-CM

## 2019-01-05 NOTE — Telephone Encounter (Signed)
  I have requested Marya Fossa for you in the referral.

## 2019-01-17 DIAGNOSIS — F411 Generalized anxiety disorder: Secondary | ICD-10-CM | POA: Diagnosis not present

## 2019-01-24 ENCOUNTER — Encounter: Payer: Self-pay | Admitting: Internal Medicine

## 2019-01-24 ENCOUNTER — Other Ambulatory Visit: Payer: Self-pay

## 2019-01-24 ENCOUNTER — Ambulatory Visit (INDEPENDENT_AMBULATORY_CARE_PROVIDER_SITE_OTHER): Payer: BC Managed Care – PPO | Admitting: Internal Medicine

## 2019-01-24 DIAGNOSIS — F411 Generalized anxiety disorder: Secondary | ICD-10-CM | POA: Diagnosis not present

## 2019-01-24 DIAGNOSIS — F5104 Psychophysiologic insomnia: Secondary | ICD-10-CM | POA: Diagnosis not present

## 2019-01-24 NOTE — Progress Notes (Signed)
Virtual Visit via [email or web address]  This visit type was conducted due to national recommendations for restrictions regarding the COVID-19 pandemic (e.g. social distancing).  This format is felt to be most appropriate for this patient at this time.  All issues noted in this document were discussed and addressed.  No physical exam was performed (except for noted visual exam findings with Video Visits).   I connected with@ on 01/24/19 at 10:30 AM EDT by a video enabled telemedicine applicationand verified that I am speaking with the correct person using two identifiers. Location patient: home Location provider: work or home office Persons participating in the virtual visit: patient, provider  I discussed the limitations, risks, security and privacy concerns of performing an evaluation and management service by telephone and the availability of in person appointments. I also discussed with the patient that there may be a patient responsible charge related to this service. The patient expressed understanding and agreed to proceed.  Reason for visit: follow up on anxiety   HPI:  58 yr old female with GAD, insomnia managed with lexapro and trazodone.  Insomnia and anxiety aggravated by loss of job.  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   Insomnia and GAD :  Taking lexapro in the am and experimenting with different meds for insomnia  and dosing schedules.  Also doing meditation,  Using music etc.  Has appt with psychologist Rodena Piety) at [facility] oct 10   Took 50 mg trazodone full tablet for the last several days  ,  Still waking up early at 4:30 am, but  felt more rested  , doesn't remember having dreams (per usual)   Prn valium use:  Has tried it at bedtime OR AT 2 am during void . No better.    Using nonpharmacologic means as well    ROS: See pertinent positives and negatives per HPI.  Past Medical History:  Diagnosis Date  . Asthma    precipitated by seasonal allergies   . 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 BRCA 1-2: No mutation. Treated with wide local excision, sentinel node biopsy and whole breast radiation.  . Colon polyp 04-07-15   TUBULAR ADENOMA  . Hemorrhoids   . Hx of radiation therapy 08/30/12- 10/04/12   left breast 50.4 gy 28 fx, upper outer aspect  boosted 63 gy  . Irritable bowel syndrome 2007   GI eval with colonoscopy [facility]  . Patient underweight   . Personal history of radiation therapy 2014   left breast ca  . Stress fracture of foot     Past Surgical History:  Procedure Laterality Date  . BREAST BIOPSY Left 2006   benign  . BREAST BIOPSY Left 2014   tubular carcinoma  . BREAST LUMPECTOMY Left 2014   tubular carcinoma clear margins LN negative  . BREAST SURGERY  2004   left, normal age 17, incisional bx  . COLONOSCOPY  2006   Dr. Vira Agar  . COLONOSCOPY N/A 04/07/2015   Procedure: COLONOSCOPY;  Surgeon: Manya Silvas, MD;  Location: [facility] ENDOSCOPY;  Service: Endoscopy;  Laterality: N/A;  . ENDOMETRIAL ABLATION  Sept 2011   Novasure  . EXPLORATORY LAPAROTOMY  Sept 2011  . LYMPH NODE BIOPSY Left 07/12/2012  . SENTINEL LYMPH NODE BIOPSY Left 06/27/2012   incisional bx/lumpectomy    Family History  Problem Relation Age of Onset  .  Cancer Mother        uterine  . Mental illness Father 90       parkinsons disease  . Cancer Paternal Grandmother        ostesarcoma hip,     SOCIAL HX:  reports that she has never smoked. She has never used smokeless tobacco. She reports current alcohol use of about 7.0 standard drinks of alcohol per week. She reports that she does not use drugs.   Current Outpatient Medications:  .  calcium citrate-vitamin D 200-200 MG-UNIT TABS, Take 2 tablets by mouth daily., Disp: , Rfl:  .  diazepam (VALIUM) 5 MG tablet, Take 1 tablet (5 mg total) by mouth at bedtime and may repeat dose  one time if needed., Disp: 30 tablet, Rfl: 0 .  escitalopram (LEXAPRO) 5 MG tablet, TAKE 1 TABLET DAILY, Disp: 90 tablet, Rfl: 3 .  ESTRACE VAGINAL 0.1 MG/GM vaginal cream, USE 1 APPLICATORFUL VAGINALLY ONCE PER WEEK, Disp: 42.5 g, Rfl: 1 .  traZODone (DESYREL) 50 MG tablet, Take 0.5-1 tablets (25-50 mg total) by mouth at bedtime as needed for sleep., Disp: 30 tablet, Rfl: 3 .  dicyclomine (BENTYL) 10 MG capsule, Take 1 capsule (10 mg total) by mouth 4 (four) times daily -  before meals and at bedtime. (Patient not taking: Reported on 01/24/2019), Disp: 30 capsule, Rfl: 3  EXAM:  VITALS per patient if applicable:  GENERAL: alert, oriented, appears well and in no acute distress  HEENT: atraumatic, conjunttiva clear, no obvious abnormalities on inspection of external nose and ears  NECK: normal movements of the head and neck  LUNGS: on inspection no signs of respiratory distress, breathing rate appears normal, no obvious gross SOB, gasping or wheezing  CV: no obvious cyanosis  MS: moves all visible extremities without noticeable abnormality  PSYCH/NEURO: pleasant and cooperative, no obvious depression or anxiety, speech and thought processing grossly intact  ASSESSMENT AND PLAN:  Discussed the following assessment and plan:  Psychophysiological insomnia  GAD (generalized anxiety disorder)  Insomnia Improved with trial of trazodone.  Encouraged to use trazodone regularly and rare use of valium.   GAD (generalized anxiety disorder) Aggravated by aging parents,  Daughter's difficulty acclimating to college, and concern about her own health issues.  Improving with Lexapro.  Psychology appointment upcoming     I discussed the assessment and treatment plan with the patient. The patient was provided an opportunity to ask questions and all were answered. The patient agreed with the plan and demonstrated an understanding of the instructions.   The patient was advised to call back or seek  an in-person evaluation if the symptoms worsen or if the condition fails to improve as anticipated.   I provided  25 minutes of non-face-to-face time during this encounter reviewing patient's current problems , providing counselling on the the above mentioned problems , and coordination  of care .  Crecencio Mc, MD

## 2019-01-24 NOTE — Assessment & Plan Note (Signed)
Aggravated by aging parents,  Daughter's difficulty acclimating to college, and concern about her own health issues.  Improving with Lexapro.  Psychology appointment upcoming

## 2019-01-24 NOTE — Assessment & Plan Note (Signed)
Improved with trial of trazodone.  Encouraged to use trazodone regularly and rare use of valium.

## 2019-01-30 DIAGNOSIS — F411 Generalized anxiety disorder: Secondary | ICD-10-CM | POA: Diagnosis not present

## 2019-02-09 ENCOUNTER — Other Ambulatory Visit: Payer: Self-pay

## 2019-02-09 ENCOUNTER — Ambulatory Visit (INDEPENDENT_AMBULATORY_CARE_PROVIDER_SITE_OTHER): Payer: BC Managed Care – PPO

## 2019-02-09 DIAGNOSIS — Z23 Encounter for immunization: Secondary | ICD-10-CM | POA: Diagnosis not present

## 2019-02-15 DIAGNOSIS — F411 Generalized anxiety disorder: Secondary | ICD-10-CM | POA: Diagnosis not present

## 2019-02-19 ENCOUNTER — Ambulatory Visit: Payer: BC Managed Care – PPO | Admitting: Psychology

## 2019-05-08 ENCOUNTER — Other Ambulatory Visit: Payer: Self-pay | Admitting: General Surgery

## 2019-05-08 DIAGNOSIS — Z853 Personal history of malignant neoplasm of breast: Secondary | ICD-10-CM

## 2019-06-12 DIAGNOSIS — D225 Melanocytic nevi of trunk: Secondary | ICD-10-CM | POA: Diagnosis not present

## 2019-06-12 DIAGNOSIS — D2261 Melanocytic nevi of right upper limb, including shoulder: Secondary | ICD-10-CM | POA: Diagnosis not present

## 2019-06-12 DIAGNOSIS — D2371 Other benign neoplasm of skin of right lower limb, including hip: Secondary | ICD-10-CM | POA: Diagnosis not present

## 2019-06-12 DIAGNOSIS — D2262 Melanocytic nevi of left upper limb, including shoulder: Secondary | ICD-10-CM | POA: Diagnosis not present

## 2019-07-04 ENCOUNTER — Ambulatory Visit
Admission: RE | Admit: 2019-07-04 | Discharge: 2019-07-04 | Disposition: A | Discharge status: Home / Self Care | Payer: BC Managed Care – PPO | Source: Ambulatory Visit | Attending: General Surgery | Admitting: General Surgery

## 2019-07-04 DIAGNOSIS — Z853 Personal history of malignant neoplasm of breast: Secondary | ICD-10-CM | POA: Diagnosis not present

## 2019-07-04 DIAGNOSIS — Z1231 Encounter for screening mammogram for malignant neoplasm of breast: Secondary | ICD-10-CM | POA: Diagnosis not present

## 2019-07-12 DIAGNOSIS — Z853 Personal history of malignant neoplasm of breast: Secondary | ICD-10-CM | POA: Diagnosis not present

## 2019-07-14 ENCOUNTER — Ambulatory Visit: Payer: BC Managed Care – PPO | Attending: Internal Medicine

## 2019-07-14 DIAGNOSIS — Z23 Encounter for immunization: Secondary | ICD-10-CM

## 2019-07-14 NOTE — Progress Notes (Signed)
   Covid-19 Vaccination Clinic  Name:  Kim Woods    MRN: [id number] DOB: [birth year]  07/14/2019  Kim Woods was observed post Covid-19 immunization for 30 minutes based on pre-vaccination screening .  During the observation period, she experienced an adverse reaction with the following symptoms:  urticaria around mouth.  Assessment : Time of assessment 1105am. Alert and oriented. No visible redness , rash, or swelling visualized.  Actions taken: Benadryl 12.5 mg tablet given Po x 1 per standing orders, VS taken with BP- 130/72, P-75, O2 sats- 100%, VS recheck at 1115, BP-122/68, P-72, O2- 100%.    Medications administered: Diphenhydramine (Benadryl) 12.5 ml (25 mg) by mouth. Time: 1105. Administered by RN.  Disposition: Reports no further symptoms of adverse reaction after observation for 30 minutes. Discharged home.   Immunizations Administered    Name Date Dose VIS Date Route   Pfizer COVID-19 Vaccine 07/14/2019 10:40 AM 0.3 mL 04/06/2019 Intramuscular   Manufacturer: [employer], [employer]   Lot: [id number]   [place]: [id number]

## 2019-07-14 NOTE — Progress Notes (Signed)
   Covid-19 Vaccination Clinic  Name:  Kim Woods    MRN: [id number] DOB: [birth year]  07/14/2019  [name] was observed post Covid-19 immunization for 30 minutes based on pre-vaccination screening without incident. She was provided with Vaccine Information Sheet and instruction to access the V-Safe system.   [name] was instructed to call 911 with any severe reactions post vaccine: [name] Difficulty breathing  . Swelling of face and throat  . A fast heartbeat  . A bad rash all over body  . Dizziness and weakness   Immunizations Administered    Name Date Dose VIS Date Route   Pfizer COVID-19 Vaccine 07/14/2019 10:40 AM 0.3 mL 04/06/2019 Intramuscular   Manufacturer: [employer], [employer]   Lot: [id number]   [place]: [id number]

## 2019-07-25 ENCOUNTER — Other Ambulatory Visit: Payer: Self-pay | Admitting: Internal Medicine

## 2019-07-25 MED ORDER — EPINEPHRINE 0.3 MG/0.3ML IJ SOAJ: 0.3000 mg | INTRAMUSCULAR | 1 refills | Status: AC | PRN

## 2019-08-08 ENCOUNTER — Ambulatory Visit: Payer: Self-pay

## 2019-08-15 DIAGNOSIS — L245 Irritant contact dermatitis due to other chemical products: Secondary | ICD-10-CM | POA: Diagnosis not present

## 2019-08-16 ENCOUNTER — Ambulatory Visit: Payer: BC Managed Care – PPO | Attending: Internal Medicine

## 2019-08-16 DIAGNOSIS — Z23 Encounter for immunization: Secondary | ICD-10-CM

## 2019-08-16 NOTE — Progress Notes (Signed)
   Covid-19 Vaccination Clinic  Name:  Kim Woods    MRN: [id number] DOB: [birth year]  08/16/2019  [name] was observed post Covid-19 immunization for 30 minutes based on pre-vaccination screening without incident. She was provided with Vaccine Information Sheet and instruction to access the V-Safe system.   [name] was instructed to call 911 with any severe reactions post vaccine: [name] Difficulty breathing  . Swelling of face and throat  . A fast heartbeat  . A bad rash all over body  . Dizziness and weakness   Immunizations Administered    Name Date Dose VIS Date Route   Pfizer COVID-19 Vaccine 08/16/2019 10:08 AM 0.3 mL 06/20/2018 Intramuscular   Manufacturer: [employer], [employer]   Lot: [id number]   [place]: [id number]

## 2019-09-18 DIAGNOSIS — M8580 Other specified disorders of bone density and structure, unspecified site: Secondary | ICD-10-CM

## 2019-09-21 MED ORDER — ESCITALOPRAM OXALATE 5 MG PO TABS: 5.0000 mg | ORAL_TABLET | Freq: Every day | ORAL | 3 refills | Status: DC

## 2019-09-21 NOTE — Telephone Encounter (Signed)
I have reordered the lexapro and the DEXA scan .

## 2019-11-14 DIAGNOSIS — H2513 Age-related nuclear cataract, bilateral: Secondary | ICD-10-CM | POA: Diagnosis not present

## 2019-11-23 ENCOUNTER — Ambulatory Visit
Admission: EM | Admit: 2019-11-23 | Discharge: 2019-11-23 | Disposition: A | Discharge status: Home / Self Care | Payer: BC Managed Care – PPO | Attending: Internal Medicine | Admitting: Internal Medicine

## 2019-11-23 ENCOUNTER — Other Ambulatory Visit: Payer: Self-pay

## 2019-11-23 DIAGNOSIS — B354 Tinea corporis: Secondary | ICD-10-CM | POA: Diagnosis not present

## 2019-11-23 MED ORDER — TERBINAFINE HCL 250 MG PO TABS: 250.0000 mg | ORAL_TABLET | Freq: Every day | ORAL | 0 refills | Status: AC

## 2019-11-23 MED ORDER — HYDROXYZINE HCL 25 MG PO TABS: 25.0000 mg | ORAL_TABLET | Freq: Three times a day (TID) | ORAL | 0 refills | Status: AC | PRN

## 2019-11-23 NOTE — ED Triage Notes (Signed)
Patient complains of rash on her breast that spreads her to nipple. Patient states that she also has some redness on her right side of back. States that area is extremely itchy and she did take benadryl. States that this started suddenly last night.

## 2019-11-25 NOTE — ED Provider Notes (Signed)
[facility]    CSN: [id number] Arrival date & time: 11/23/19  1940      History   Chief Complaint Chief Complaint  Patient presents with  . Rash    HPI Kim Woods is a 59 y.o. female with a history of left breast cancer status post-localized incision and whole breast radiation with her last normal mammogram exam 3/21 comes to the urgent care with complaints of itchy rash on the left breast and right side of the back.  Patient states that the symptoms started last night.  No pain, numbness tingling preceding the rash.  No fever or chills.  No nipple discharge.  Patient took Benadryl with partial relief.  She comes to the urgent care to be evaluated.  No history of shingles. HPI  Past Medical History:  Diagnosis Date  . Asthma    precipitated by seasonal allergies   . 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 BRCA 1-2: No mutation. Treated with wide local excision, sentinel node biopsy and whole breast radiation.  . Colon polyp 04-07-15   TUBULAR ADENOMA  . Hemorrhoids   . Hx of radiation therapy 08/30/12- 10/04/12   left breast 50.4 gy 28 fx, upper outer aspect  boosted 63 gy  . Irritable bowel syndrome 2007   GI eval with colonoscopy [place] [facility]  . Patient underweight   . Personal history of radiation therapy 2014   left breast ca  . Stress fracture of foot     Patient Active Problem List   Diagnosis Date Noted  . Post-menopausal atrophic vaginitis 12/24/2018  . Insomnia 11/03/2018  . Dermatitis due to unknown cause 03/14/2018  . GAD (generalized anxiety disorder) 07/16/2015  . History of breast cancer 12/24/2012  . Encounter for preventive health examination 12/17/2012  . Hx of radiation therapy   . Irritable bowel syndrome 06/22/2011  . Patient underweight   . Asthma with allergic rhinitis     Past Surgical History:  Procedure Laterality Date  . BREAST BIOPSY Left 2006   benign    . BREAST BIOPSY Left 2014   tubular carcinoma  . BREAST LUMPECTOMY Left 2014   tubular carcinoma clear margins LN negative  . BREAST SURGERY  2004   left, normal age 60, incisional bx  . COLONOSCOPY  2006   Dr. Vira Agar  . COLONOSCOPY N/A 04/07/2015   Procedure: COLONOSCOPY;  Surgeon: Manya Silvas, MD;  Location: [facility] ENDOSCOPY;  Service: Endoscopy;  Laterality: N/A;  . ENDOMETRIAL ABLATION  Sept 2011   Novasure  . EXPLORATORY LAPAROTOMY  Sept 2011  . LYMPH NODE BIOPSY Left 07/12/2012  . SENTINEL LYMPH NODE BIOPSY Left 06/27/2012   incisional bx/lumpectomy    OB History    Gravida  1   Para  1   Term      Preterm      AB      Living  1     SAB      TAB      Ectopic      Multiple      Live Births           Obstetric Comments  first pregnancy 58 First menstrual 15         Home Medications    Prior to Admission medications   Medication Sig Start Date End Date Taking? Authorizing Provider  calcium citrate-vitamin D 200-200 MG-UNIT TABS Take 2 tablets by mouth  daily.   Yes [provider]  EPINEPHrine 0.3 mg/0.3 mL IJ SOAJ injection Inject 0.3 mLs (0.3 mg total) into the muscle as needed for anaphylaxis. 07/25/19  Yes Crecencio Mc, MD  ESTRACE VAGINAL 0.1 MG/GM vaginal cream USE 1 APPLICATORFUL VAGINALLY ONCE PER WEEK 08/24/17  Yes Byrnett, Forest Gleason, MD  traZODone (DESYREL) 50 MG tablet Take 0.5-1 tablets (25-50 mg total) by mouth at bedtime as needed for sleep. 11/01/18  Yes Crecencio Mc, MD  hydrOXYzine (ATARAX/VISTARIL) 25 MG tablet Take 1 tablet (25 mg total) by mouth every 8 (eight) hours as needed for itching. 11/23/19   Nicle Connole, Myrene Galas, MD  terbinafine (LAMISIL) 250 MG tablet Take 1 tablet (250 mg total) by mouth daily for 7 days. 11/23/19 11/30/19  LampteyMyrene Galas, MD  dicyclomine (BENTYL) 10 MG capsule Take 1 capsule (10 mg total) by mouth 4 (four) times daily -  before meals and at bedtime. Patient not taking: Reported on 01/24/2019  11/01/18 11/23/19  Crecencio Mc, MD  escitalopram (LEXAPRO) 5 MG tablet Take 1 tablet (5 mg total) by mouth daily. 09/21/19 11/23/19  Crecencio Mc, MD    Family History Family History  Problem Relation Age of Onset  . Cancer Mother        uterine  . Mental illness Father 56       parkinsons disease  . Cancer Paternal Grandmother        ostesarcoma hip,     Social History Social History   Tobacco Use  . Smoking status: Never Smoker  . Smokeless tobacco: Never Used  Substance Use Topics  . Alcohol use: Yes    Alcohol/week: 7.0 standard drinks    Types: 7 Glasses of wine per week  . Drug use: No     Allergies   Ciprofloxacin, Nitrofurantoin monohyd macro, and Cefuroxime axetil   Review of Systems Review of Systems  Constitutional: Negative for unexpected weight change.  Respiratory: Negative.   Cardiovascular: Negative.   Musculoskeletal: Negative.   Skin: Positive for color change and rash.  Psychiatric/Behavioral: Negative.      Physical Exam Triage Vital Signs ED Triage Vitals  Enc Vitals Group     BP 11/23/19 2006 (!) 145/91     Pulse Rate 11/23/19 2006 98     Resp 11/23/19 2006 17     Temp 11/23/19 2006 98.5 F (36.9 C)     Temp Source 11/23/19 2006 Oral     SpO2 11/23/19 2006 99 %     Weight 11/23/19 2006 120 lb (54.4 kg)     Height 11/23/19 2006 5' 7"  (1.702 m)     Head Circumference --      Peak Flow --      Pain Score 11/23/19 2005 2     Pain Loc --      Pain Edu? --      Excl. in [place]? --    No data found.  Updated Vital Signs BP (!) 145/91 (BP Location: Right Arm)   Pulse 98   Temp 98.5 F (36.9 C) (Oral)   Resp 17   Ht 5' 7"  (1.702 m)   Wt 54.4 kg   LMP 12/25/2009   SpO2 99%   BMI 18.79 kg/m   Visual Acuity Right Eye Distance:   Left Eye Distance:   Bilateral Distance:    Right Eye Near:   Left Eye Near:    Bilateral Near:     Physical Exam Vitals and nursing  note reviewed.  Constitutional:      General: She is not in  acute distress.    Appearance: She is not ill-appearing.  Cardiovascular:     Rate and Rhythm: Normal rate and regular rhythm.  Musculoskeletal:        General: No swelling or tenderness. Normal range of motion.     Comments: Rash on the back is well-circumscribed and measures about 1 inch in the longest diameter.  No vesicles.  Skin:    General: Skin is warm.     Coloration: Skin is not pale.     Findings: Erythema and rash present. No lesion.     Comments: Breast exam showed normal nipples bilaterally.  No masses palpated.  Axillae is without any masses or lymphadenopathy.  No nipple discharge.  Neurological:     Mental Status: She is alert.      UC Treatments / Results  Labs (all labs ordered are listed, but only abnormal results are displayed) Labs Reviewed - No data to display  EKG   Radiology No results found.  Procedures Procedures (including critical care time)  Medications Ordered in UC Medications - No data to display  Initial Impression / Assessment and Plan / UC Course  I have reviewed the triage vital signs and the nursing notes.  Pertinent labs & imaging results that were available during my care of the patient were reviewed by me and considered in my medical decision making (see chart for details).     1.  Tinea corporis: Lamisil 250 mg orally daily for 7 days Hydralazine as needed for itching Apply emollients to the area involved If symptoms worsen please follow-up with your primary care doctor or your primary oncologist to be reevaluated Return precautions given. Final Clinical Impressions(s) / UC Diagnoses   Final diagnoses:  Tinea corporis   Discharge Instructions   None    ED Prescriptions    Medication Sig Dispense Auth. Provider   terbinafine (LAMISIL) 250 MG tablet Take 1 tablet (250 mg total) by mouth daily for 7 days. 7 tablet Felcia Huebert, Myrene Galas, MD   hydrOXYzine (ATARAX/VISTARIL) 25 MG tablet Take 1 tablet (25 mg total) by mouth  every 8 (eight) hours as needed for itching. 12 tablet Shayleigh Bouldin, Myrene Galas, MD     PDMP not reviewed this encounter.   Chase Picket, MD 11/25/19 [phone]

## 2019-11-28 ENCOUNTER — Telehealth: Payer: Self-pay | Admitting: Internal Medicine

## 2019-11-28 DIAGNOSIS — Z20822 Contact with and (suspected) exposure to covid-19: Secondary | ICD-10-CM | POA: Diagnosis not present

## 2019-11-28 DIAGNOSIS — B354 Tinea corporis: Secondary | ICD-10-CM | POA: Diagnosis not present

## 2019-11-28 DIAGNOSIS — L905 Scar conditions and fibrosis of skin: Secondary | ICD-10-CM | POA: Diagnosis not present

## 2019-11-28 NOTE — Telephone Encounter (Signed)
Spoke with pt and advised her of Dr. Tullo's message below. Pt gave a verbal understanding.  °

## 2019-11-28 NOTE — Telephone Encounter (Signed)
She can wait until daughter's test is resulted.  If positive,  She can get a rapid test on Thursday or Friday

## 2019-11-28 NOTE — Telephone Encounter (Signed)
Pt's daughter is being tested for covid and she doesn't have symptoms but want to know if she should be tested since she is seeing her elderly mother this weekend? She was in contact with her daughter 2 days before she developed symptoms and had to be tested. She doesn't have daughter's results yet.

## 2020-02-08 ENCOUNTER — Other Ambulatory Visit: Payer: Self-pay

## 2020-02-08 ENCOUNTER — Ambulatory Visit (INDEPENDENT_AMBULATORY_CARE_PROVIDER_SITE_OTHER): Payer: BC Managed Care – PPO

## 2020-02-08 DIAGNOSIS — Z23 Encounter for immunization: Secondary | ICD-10-CM | POA: Diagnosis not present
